# Patient Record
Sex: Female | Born: 1955 | Race: White | Hispanic: No | Marital: Single | Smoking: Never smoker
Health system: Southern US, Community
[De-identification: ages and names within clinical notes are randomized; demographics above are authoritative.]

## PROBLEM LIST (undated history)

## (undated) ENCOUNTER — Emergency Department (HOSPITAL_COMMUNITY): Discharge status: Against Medical Advice | Payer: Self-pay

## (undated) DIAGNOSIS — I10 Essential (primary) hypertension: Secondary | ICD-10-CM

## (undated) HISTORY — PX: CHOLECYSTECTOMY: SHX55

## (undated) HISTORY — PX: BACK SURGERY: SHX140

---

## 2006-02-03 ENCOUNTER — Ambulatory Visit: Payer: Self-pay | Admitting: Physician Assistant

## 2006-07-28 ENCOUNTER — Ambulatory Visit (HOSPITAL_COMMUNITY): Admission: RE | Admit: 2006-07-28 | Discharge: 2006-07-28 | Payer: Self-pay | Admitting: Neurological Surgery

## 2006-10-19 ENCOUNTER — Ambulatory Visit: Payer: Self-pay | Admitting: *Deleted

## 2006-10-19 ENCOUNTER — Inpatient Hospital Stay (HOSPITAL_COMMUNITY): Admission: RE | Admit: 2006-10-19 | Discharge: 2006-10-22 | Payer: Self-pay | Admitting: Neurological Surgery

## 2007-10-03 ENCOUNTER — Ambulatory Visit: Payer: Self-pay | Admitting: Internal Medicine

## 2008-05-13 IMAGING — RF DG LUMBAR SPINE 2-3V
1 series · 2 of 2 positions shown · non-contrast
Comparison: Lumbar myelogram 07/28/06.

CLINICAL DATA: L5-S1 ALIF fusion. 
 PORTABLE LUMBAR SPINE - 2 VIEW:

[Series 1: run · 2 of 2 slices shown]
[im 1/2]
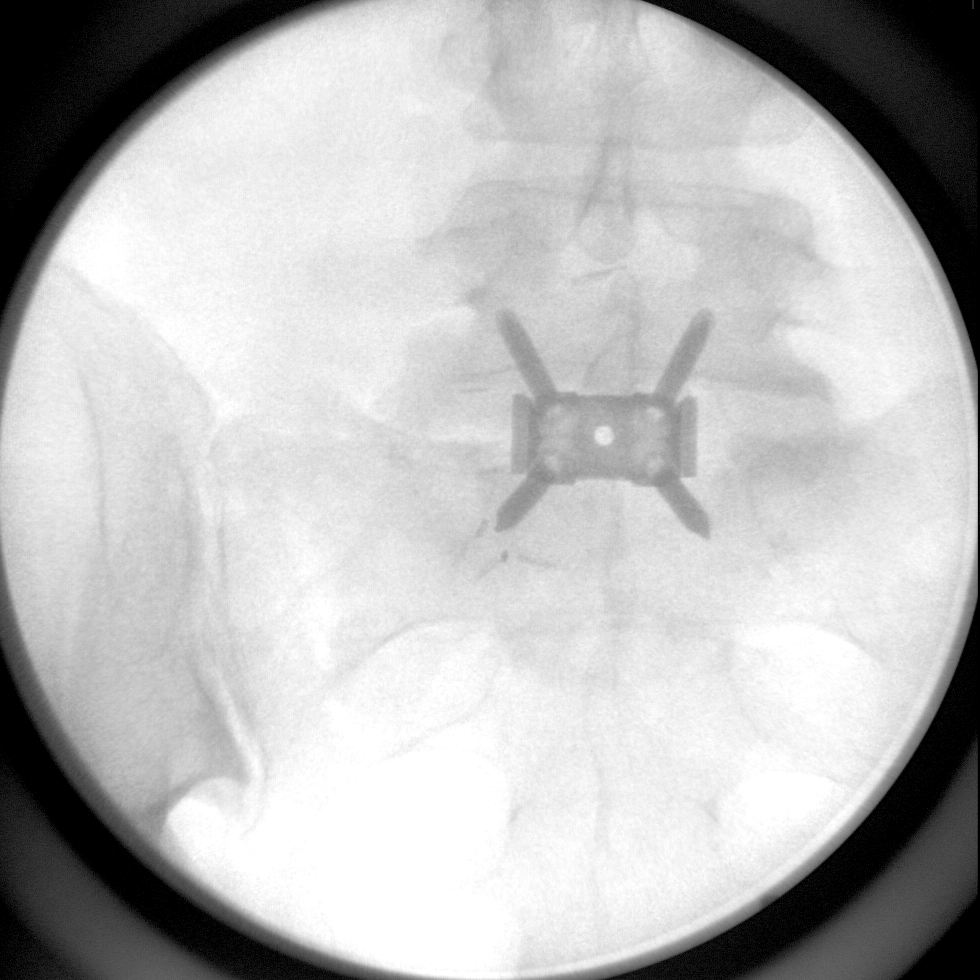
[im 2/2]
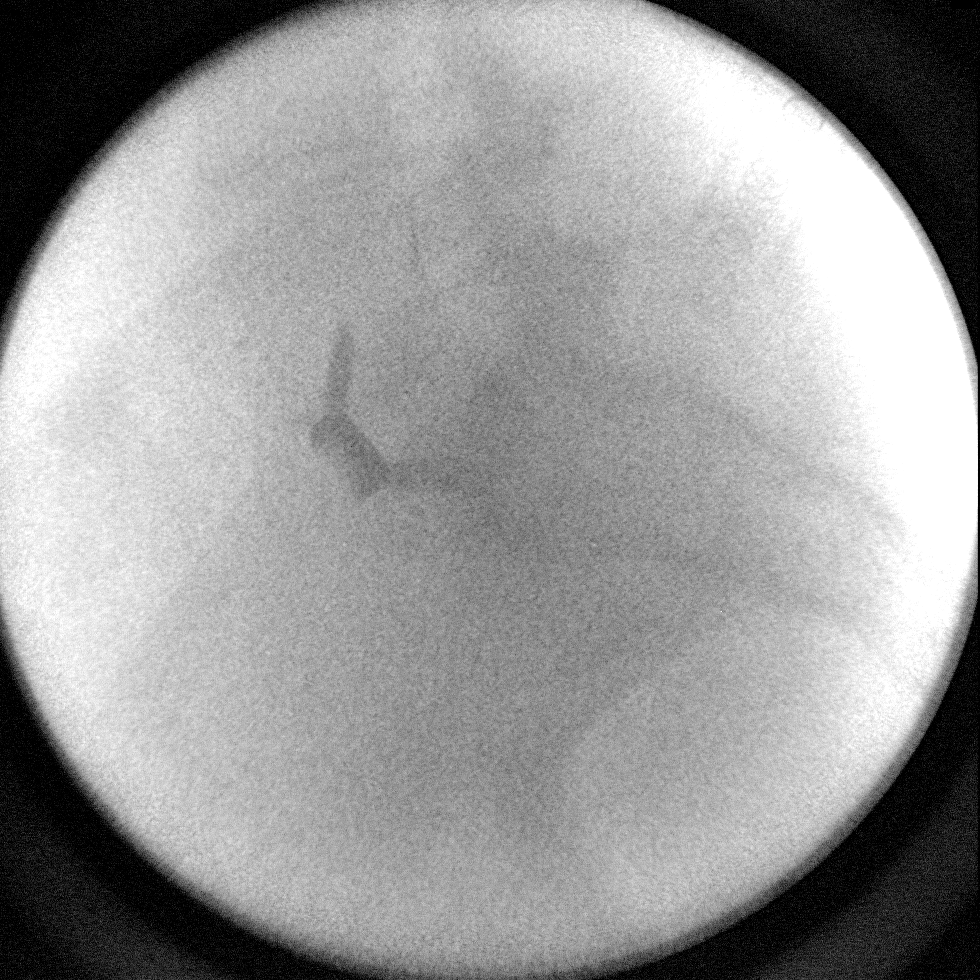

[2 of 2 positions shown; findings below may reference images not displayed]

FINDINGS: AP and lateral spot fluoroscopic images are submitted postoperatively from the operating room. These demonstrate the placement of an anterior fusion device at L5-S1, appearing in adequate position. There is no apparent complication.
IMPRESSION: Intraoperative views during anterior fusion at L5-S1.

## 2010-05-04 ENCOUNTER — Encounter: Payer: Self-pay | Admitting: Neurological Surgery

## 2010-08-26 NOTE — Op Note (Signed)
NAME:  [name], [name] NO.:  [id number]   MEDICAL RECORD NO.:  [id number]          PATIENT TYPE:  INP   LOCATION:  3034                         FACILITY:  [facility]   PHYSICIAN:  Balinda Quails, M.D.    DATE OF BIRTH:  1955-07-01   DATE OF PROCEDURE:  10/19/2006  DATE OF DISCHARGE:                               OPERATIVE REPORT   SURGEON:  Denman George, MD.   CO-SURGEON:  Stefani Dama MD.   ANESTHETIC:  General endotracheal.   ANESTHESIOLOGIST:  Bedelia Person, M.D.   PREOPERATIVE DIAGNOSIS:  L5-S1 degenerative disk disease.   POSTOPERATIVE DIAGNOSIS:  L5-S1 degenerative disk disease.   PROCEDURE:  Right retroperitoneal anterior spine access for L5-S1  anterior lumbar interbody fusion.   CLINICAL NOTE:  [name] is, a 55 year old obese female with history  of chronic back pain and degenerative L5-S1 disk disease.  Brought to  the operating room at this time for L5-S1 anterior lumbar interbody  fusion.  This was carried out in conjunction with Dr. Danielle Dess.  The  patient was seen preoperatively in the holding area, the procedure  explained.  Potential risks of the operative procedure were discussed  with the patient including but not limited to bleeding, infection, DVT  and pulmonary embolus, transfusion, limb ischemia, ureter injury, hernia  and death.   OPERATIVE PROCEDURE:  The patient brought to the operating room in  stable hemodynamic condition.  Placed in supine position.  General  endotracheal anesthesia induced.  Using fluoroscopy, the projection of  the L5-S1 disk space was marked on the anterior abdominal wall.  The  abdomen then prepped and draped in sterile fashion.  Pulse oximeter was  placed on the left foot.  This measured 100%.   A transverse skin incision made in the right lower quadrant from midline  to lateral margin of the rectus.  Subcutaneous tissue divided with  electrocautery.  The right anterior rectus sheath was divided from  midline to lateral border.  Superior and inferior flaps of anterior  rectus sheath were created.  The right rectus muscle was some mobilized  bluntly.  Retracted medially.  The retroperitoneal space entered in the  right lower quadrant.  The poster rectus sheath was divided at the  arcuate line.  The retroperitoneal space created bluntly.  The psoas  muscle and genitofemoral nerve were identified.  The genitofemoral nerve  left on the psoas muscle.  The ureter was then mobilized and retracted  with the abdominal contents.  The right common and external iliac artery  identified.  The right common iliac vein retracted to the right.  The L5-  S1 disk space was identified.  The presacral veins were cauterized with  bipolar device.  The fascia was incised over the L5-S1 disk.  Using  blunt dissection the L5-S1 disk was cleared from right to left.  The  left common iliac vein retracted to the left.   The Thompson-Brau retractor was then assembled.  Using the reverse Brau  lip blades, the L5-S1 disk was exposed.  Using malleable blades, the  disk was  further exposed superiorly and inferiorly.   Dr. Danielle Dess then completed excision of the disk with L5-S1 anterior  lumbar interbody fusion.   There were no apparent complications.  No significant bleeding.  Once  the ALIF was completed, the retractors removed.  Once again this was  inspected to assure there was no bleeding.  Pulse oximeter remained 100%  on the left foot.   The anterior rectus sheath was then closed with running 0 Vicryl suture.  The deep subcutaneous layer closed with running 2-0 Vicryl suture.  Subcutaneous tissue closed with running 3-0 Vicryl suture.  Skin closed  with 4-0 Monocryl.  Steri-Strips applied.  There were no apparent  complications.  The patient transferred to recovery room in stable  condition.      Balinda Quails, M.D.  Electronically Signed     PGH/MEDQ  D:  10/20/2006  T:  10/20/2006  Job:  [id number]

## 2010-08-26 NOTE — Op Note (Signed)
NAMEKARYNA, [name] NO.:  [id number]   MEDICAL RECORD NO.:  [id number]          PATIENT TYPE:  INP   LOCATION:  3034                         FACILITY:  [facility]   PHYSICIAN:  Stefani Dama, M.D.  DATE OF BIRTH:  21-Nov-1955   DATE OF PROCEDURE:  10/19/2006  DATE OF DISCHARGE:                               OPERATIVE REPORT   PREOPERATIVE DIAGNOSES:  1. Herniated nucleus pulposus L5-S1 extraforaminal.  2. Left L5 radiculopathy.   POSTOPERATIVE DIAGNOSES:  1. Herniated nucleus pulposus L5-S1 extraforaminal.  2. Left L5 radiculopathy.   PROCEDURES:  1. Anterior lumbar interbody decompression and fusion L5-S1 with      [employer] instrumentation.  2. Local autograft and allograft.   SURGEON:  Stefani Dama, M.D.   FIRST ASSISTANT:  Coletta Memos, M.D.   APPROACH AND CLOSURE:  Balinda Quails, M.D.   INDICATIONS:  [name] is a 55 year old individual who has had a long  time problem with chronic back pain and left lower extremity pain.  She  has an extraforaminal protrusion of the disk at the L5-S1 level on the  left.  After discussing considerations of conservative management and  surgical treatment for the past year's time, I have advised that it  would be best to treat the process and a need for decompression, and  also the stabilization of the L5-S1 joint; also anterior approach using  an anterior lumbar interbody fusion with a PEEK bone spacer from  [employer].  She is now being taken to the operating room for this  procedure.   PROCEDURE:  The patient was brought to the operating room, placed on the  table in supine position.  After the smooth induction of general  endotracheal anesthesia, fluoroscopic guidance was used to localize the  L5-S1 space.  Then, the exposure was performed by Dr. Liliane Bade to  expose the anterior lumbar interbody space at L5-S1.  The retractor  being set into position, the anterior longitudinal ligament at the disk  space  was opened with #15 blade.  A combination of Kerrison rongeurs was  then used to perform a complete diskectomy.  On left side in the lateral  gutter and the extraforaminal space, the disk was carefully cleaned out  and a ligament was noted to be bowed dorsally, with the subligamentous  portion of the disk in the region of the nerve root compression.  This  area was decompressed.  After removing all remnants of the disk off to  the left and to the right side, the disk space was inspected carefully  and all remnants of cartilage were removed.  The endplates were shaved  with a barrel bit and smoothed appropriately.  Good bleeding was  obtained from either cortex.  The interspace was sized and it was felt  that the smaller of the implants would fit best.  Initially a 13.5 mm  height was selected, but this did not provide sufficient distraction  posteriorly.  A 15 mm height was ultimately used in a small stature.  This was then filled with tricalcium phosphate bone substitute, and  a  bone marrow aspirate was obtained from the vertebral body of L5 using a  Jamshidi needle.  The bone marrow aspirate was mixed with tricalcium  phosphate, and this was packed into the interspace after being packed  into the PEEK spacer.  The Squid interbody applicator was used to place  the spacer along with the titanium anterior plate.  The plate was  appropriately countersunk once the Squid was removed.  Four 20 mm screws  were then used to secure the plate and the anterior device in position.  Final radiographic appearance of the interbody spacer was obtained with  AP and lateral fluoroscopic imaging.  When this was found to be good,  the procedure was then turned back over to Dr. Madilyn Fireman for final closure.   Blood loss for this portion of procedure was estimated about 100 mL.  The patient tolerated the procedure well.      Stefani Dama, M.D.  Electronically Signed     HJE/MEDQ  D:  10/19/2006  T:   10/20/2006  Job:  [id number]

## 2011-01-27 LAB — POCT I-STAT 4, (NA,K, GLUC, HGB,HCT)
Glucose, Bld: 98
HCT: 46
Operator id: 181601
Potassium: 4.1
Sodium: 135

## 2011-01-27 LAB — TYPE AND SCREEN
ABO/RH(D): B POS
Antibody Screen: NEGATIVE

## 2011-01-27 LAB — ABO/RH: ABO/RH(D): B POS

## 2011-01-27 LAB — BASIC METABOLIC PANEL
Calcium: 9.4
Creatinine, Ser: 0.65
GFR calc Af Amer: >60
Sodium: 139

## 2011-01-27 LAB — CBC
Hemoglobin: 13.2
RBC: 4.93
WBC: 9.1

## 2013-10-11 ENCOUNTER — Other Ambulatory Visit: Payer: Self-pay | Admitting: Neurological Surgery

## 2013-10-11 DIAGNOSIS — M5416 Radiculopathy, lumbar region: Secondary | ICD-10-CM

## 2013-10-22 ENCOUNTER — Ambulatory Visit
Admission: RE | Admit: 2013-10-22 | Discharge: 2013-10-22 | Disposition: A | Discharge status: Home / Self Care | Payer: BC Managed Care – PPO | Source: Ambulatory Visit | Attending: Neurological Surgery | Admitting: Neurological Surgery

## 2013-10-22 DIAGNOSIS — M5416 Radiculopathy, lumbar region: Secondary | ICD-10-CM

## 2014-01-18 ENCOUNTER — Other Ambulatory Visit (HOSPITAL_COMMUNITY): Payer: Self-pay | Admitting: Neurological Surgery

## 2014-01-18 ENCOUNTER — Other Ambulatory Visit: Payer: Self-pay | Admitting: Neurological Surgery

## 2014-01-18 DIAGNOSIS — M5410 Radiculopathy, site unspecified: Secondary | ICD-10-CM

## 2014-02-02 ENCOUNTER — Encounter (HOSPITAL_COMMUNITY): Payer: Self-pay | Admitting: Pharmacy Technician

## 2014-02-08 ENCOUNTER — Ambulatory Visit (HOSPITAL_COMMUNITY): Admission: RE | Admit: 2014-02-08 | Payer: BC Managed Care – PPO | Source: Ambulatory Visit

## 2014-02-22 ENCOUNTER — Ambulatory Visit (HOSPITAL_COMMUNITY)
Admission: RE | Admit: 2014-02-22 | Discharge: 2014-02-22 | Disposition: A | Discharge status: Home / Self Care | Payer: BC Managed Care – PPO | Source: Ambulatory Visit | Attending: Neurological Surgery | Admitting: Neurological Surgery

## 2014-02-22 DIAGNOSIS — M5136 Other intervertebral disc degeneration, lumbar region: Secondary | ICD-10-CM | POA: Diagnosis not present

## 2014-02-22 DIAGNOSIS — M4726 Other spondylosis with radiculopathy, lumbar region: Secondary | ICD-10-CM | POA: Diagnosis present

## 2014-02-22 DIAGNOSIS — M2578 Osteophyte, vertebrae: Secondary | ICD-10-CM | POA: Diagnosis not present

## 2014-02-22 DIAGNOSIS — M5410 Radiculopathy, site unspecified: Secondary | ICD-10-CM

## 2014-02-22 DIAGNOSIS — M5125 Other intervertebral disc displacement, thoracolumbar region: Secondary | ICD-10-CM | POA: Diagnosis not present

## 2014-02-22 DIAGNOSIS — Z981 Arthrodesis status: Secondary | ICD-10-CM | POA: Diagnosis not present

## 2014-02-22 DIAGNOSIS — M47898 Other spondylosis, sacral and sacrococcygeal region: Secondary | ICD-10-CM | POA: Insufficient documentation

## 2014-02-22 DIAGNOSIS — M4716 Other spondylosis with myelopathy, lumbar region: Secondary | ICD-10-CM | POA: Diagnosis present

## 2014-02-22 MED ORDER — ONDANSETRON HCL 4 MG/2ML IJ SOLN: 4.0000 mg | Freq: Four times a day (QID) | INTRAMUSCULAR | Status: DC | PRN

## 2014-02-22 MED ORDER — DIAZEPAM 5 MG PO TABS
ORAL_TABLET | ORAL | Status: AC
  Filled 2014-02-22: qty 2

## 2014-02-22 MED ORDER — ONDANSETRON HCL 4 MG/2ML IJ SOLN
4.0000 mg | Freq: Four times a day (QID) | INTRAMUSCULAR | Status: DC | PRN
Start: 2014-02-22 — End: 2014-02-23

## 2014-02-22 MED ORDER — DIAZEPAM 5 MG PO TABS
10.0000 mg | ORAL_TABLET | Freq: Once | ORAL | Status: AC
  Administered 2014-02-22: 10 mg via ORAL

## 2014-02-22 MED ORDER — MAGNESIUM HYDROXIDE 400 MG/5ML PO SUSP
30.0000 mL | Freq: Every day | ORAL | Status: DC | PRN
  Filled 2014-02-22: qty 30

## 2014-02-22 MED ORDER — HYDROCODONE-ACETAMINOPHEN 5-325 MG PO TABS: 1.0000 | ORAL_TABLET | ORAL | Status: DC | PRN

## 2014-02-22 MED ORDER — IOHEXOL 180 MG/ML  SOLN
20.0000 mL | Freq: Once | INTRAMUSCULAR | Status: AC | PRN
  Administered 2014-02-22: 12 mL via INTRATHECAL

## 2014-02-22 NOTE — Discharge Instructions (Signed)
Myelogram and Lumbar Puncture Discharge Instructions  1. Go home and rest quietly for the next 24 hours.  It is important to lie flat for the next 24 hours.  Get up only to go to the restroom.  You may lie in the bed or on a couch on your back, your stomach, your left side or your right side.  You may have one pillow under your head.  You may have pillows between your knees while you are on your side or under your knees while you are on your back.  2. DO NOT drive today.  Recline the seat as far back as it will go, while still wearing your seat belt, on the way home.  3. You may get up to go to the bathroom as needed.  You may sit up for 10 minutes to eat.  You may resume your normal diet and medications unless otherwise indicated.  4. The incidence of headache, nausea, or vomiting is about 5% (one in 20 patients).  If you develop a headache, lie flat and drink plenty of fluids until the headache goes away.  Caffeinated beverages may be helpful.  If you develop severe nausea and vomiting or a headache that does not go away with flat bed rest, call [phone].  5. You may resume normal activities after your 24 hours of bed rest is over; however, do not exert yourself strongly or do any heavy lifting tomorrow.  6. Call your physician for a follow-up appointment.  The results of your myelogram will be sent directly to your physician by the following day.  7. If you have any questions or if complications develop after you arrive home, please call [phone].  Discharge instructions have been explained to the patient.  The patient, or the person responsible for the patient, fully understands these instructions.   

## 2014-02-22 NOTE — Procedures (Signed)
Excell SeltzerKathy Preston is a 58 year old individual who has had severe cramping in both lower extremities worse on the left than on the right with numbness and tingling in the left lower extremity. She has had a previous decompression and fusion at L5-S1 done via an anterior procedure. She did well for appear to time but then had recurrence of pain. Plain radiographs demonstrate significant adjacent level disease with degeneration at L1-L2.she has had primarily left lower extremity pain and some weakness. An MRI performed in July demonstrates degenerative changes but no overt neural component compressive lesion. There is some degradation secondary to motion. A myelogram is now being performed to better delineate any path of anatomy that may be helped.   Pre op WU:[id number]:spondylosis lumbar with left lumbar radiculopathy. Status post arthrodesis L5-S1 Post op FA:[id number]:spondylosis lumbar with left lumbar radiculopathy. Status post arthrodesis L5-S1 Procedure:lumbar myelogram Surgeon:Birtha Hatler Puncture level:L2-L3 Fluid color:clear colorless Injection:iohexol 180 12 mL Findings:diffuse spondylosis at L4-L5 no overt nerve root compromise, further evaluation with post myelogram CT scan.

## 2015-08-08 ENCOUNTER — Emergency Department
Admission: EM | Admit: 2015-08-08 | Discharge: 2015-08-08 | Disposition: A | Discharge status: Home / Self Care | Payer: Self-pay | Attending: Emergency Medicine | Admitting: Emergency Medicine

## 2015-08-08 ENCOUNTER — Emergency Department: Payer: Self-pay

## 2015-08-08 DIAGNOSIS — R1013 Epigastric pain: Secondary | ICD-10-CM | POA: Insufficient documentation

## 2015-08-08 LAB — COMPREHENSIVE METABOLIC PANEL
ALK PHOS: 171 U/L — AB (ref 38–126)
ALT: 94 U/L — ABNORMAL HIGH (ref 14–54)
AST: 55 U/L — ABNORMAL HIGH (ref 15–41)
Albumin: 4 g/dL (ref 3.5–5.0)
Anion gap: 14 (ref 5–15)
BUN: 15 mg/dL (ref 6–20)
CALCIUM: 10.3 mg/dL (ref 8.9–10.3)
CO2: 28 mmol/L (ref 22–32)
Chloride: 98 mmol/L — ABNORMAL LOW (ref 101–111)
Creatinine, Ser: 0.58 mg/dL (ref 0.44–1.00)
Glucose, Bld: 130 mg/dL — ABNORMAL HIGH (ref 65–99)
Potassium: 3.1 mmol/L — ABNORMAL LOW (ref 3.5–5.1)
Sodium: 140 mmol/L (ref 135–145)
Total Bilirubin: 1.4 mg/dL — ABNORMAL HIGH (ref 0.3–1.2)
Total Protein: 8.2 g/dL — ABNORMAL HIGH (ref 6.5–8.1)

## 2015-08-08 LAB — URINALYSIS COMPLETE WITH MICROSCOPIC (ARMC ONLY)
BACTERIA UA: NONE SEEN
BILIRUBIN URINE: NEGATIVE
Glucose, UA: NEGATIVE mg/dL
Leukocytes, UA: NEGATIVE
Nitrite: NEGATIVE
Protein, ur: NEGATIVE mg/dL
Specific Gravity, Urine: 1.014 (ref 1.005–1.030)
pH: 7 (ref 5.0–8.0)

## 2015-08-08 LAB — CBC
HEMATOCRIT: 41.2 % (ref 35.0–47.0)
HEMOGLOBIN: 13.8 g/dL (ref 12.0–16.0)
MCH: 26.9 pg (ref 26.0–34.0)
MCHC: 33.4 g/dL (ref 32.0–36.0)
MCV: 80.6 fL (ref 80.0–100.0)
PLATELETS: 304 10*3/uL (ref 150–440)
RBC: 5.11 MIL/uL (ref 3.80–5.20)
RDW: 13.9 % (ref 11.5–14.5)
WBC: 13.8 10*3/uL — AB (ref 3.6–11.0)

## 2015-08-08 LAB — TROPONIN I: Troponin I: <0.03 ng/mL (ref <0.031)

## 2015-08-08 LAB — LIPASE, BLOOD: Lipase: 25 U/L (ref 11–51)

## 2015-08-08 MED ORDER — DICYCLOMINE HCL 20 MG PO TABS: 20.0000 mg | ORAL_TABLET | Freq: Three times a day (TID) | ORAL | Status: DC | PRN

## 2015-08-08 MED ORDER — GI COCKTAIL ~~LOC~~
30.0000 mL | Freq: Once | ORAL | Status: AC
  Administered 2015-08-08: 30 mL via ORAL
  Filled 2015-08-08: qty 30

## 2015-08-08 MED ORDER — ONDANSETRON HCL 4 MG/2ML IJ SOLN
INTRAMUSCULAR | Status: AC
  Administered 2015-08-08: 4 mg via INTRAVENOUS
  Filled 2015-08-08: qty 2

## 2015-08-08 MED ORDER — ONDANSETRON HCL 4 MG PO TABS: 4.0000 mg | ORAL_TABLET | Freq: Three times a day (TID) | ORAL | Status: DC | PRN

## 2015-08-08 MED ORDER — POTASSIUM CHLORIDE CRYS ER 20 MEQ PO TBCR
20.0000 meq | EXTENDED_RELEASE_TABLET | Freq: Once | ORAL | Status: AC
  Administered 2015-08-08: 20 meq via ORAL
  Filled 2015-08-08: qty 1

## 2015-08-08 MED ORDER — SODIUM CHLORIDE 0.9 % IV BOLUS (SEPSIS)
500.0000 mL | Freq: Once | INTRAVENOUS | Status: AC
  Administered 2015-08-08: 500 mL via INTRAVENOUS

## 2015-08-08 MED ORDER — ONDANSETRON HCL 4 MG/2ML IJ SOLN
4.0000 mg | Freq: Once | INTRAMUSCULAR | Status: AC
  Administered 2015-08-08: 4 mg via INTRAVENOUS

## 2015-08-08 MED ORDER — ONDANSETRON HCL 4 MG/2ML IJ SOLN
4.0000 mg | Freq: Once | INTRAMUSCULAR | Status: AC
  Administered 2015-08-08: 4 mg via INTRAVENOUS
  Filled 2015-08-08: qty 2

## 2015-08-08 NOTE — ED Notes (Signed)
Patient transported to Ultrasound 

## 2015-08-08 NOTE — ED Notes (Signed)
Pt in with co n.v.d since this am with co "fullness to her chest".  Denies any abd pain but has had a fever.

## 2015-08-08 NOTE — ED Provider Notes (Signed)
Time Seen: Approximately 2000 I have reviewed the triage notes  Chief Complaint: Emesis   History of Present Illness: [name] is a 60 y.o. female *who states that she's having some epigastric pain and some fullness in the lower part of her chest. Patient states she started this morning with nausea and vomited multiple times with no obvious blood or bile. Patient states multiple loose watery stool bowel movements. She denies any recent antibiotic therapy. She denies any recent travel or food borne exposure. She denies any melena or hematochezia. She states she was concerned because the pain and points mainly to the lower sternal region. She states it occasionally hurts to take a deep breath. She denies any arm, jaw, back pain. No past medical history on file.  There are no active problems to display for this patient.   No past surgical history on file.  No past surgical history on file.  Current Outpatient Rx  Name  Route  Sig  Dispense  Refill  . chlorthalidone (HYGROTON) 25 MG tablet   Oral   Take 25 mg by mouth daily.         Maxwell Caul Bicarbonate (ZEGERID OTC) 20-1100 MG CAPS capsule   Oral   Take 1 capsule by mouth daily as needed (for acid reflux).         . traMADol (ULTRAM) 50 MG tablet   Oral   Take 50 mg by mouth daily as needed for moderate pain.           Allergies:  Oxycodone-acetaminophen  Family History: No family history on file.  Social History: Social History  Substance Use Topics  . Smoking status: Not on file  . Smokeless tobacco: Not on file  . Alcohol Use: Not on file     Review of Systems:   10 point review of systems was performed and was otherwise negative:  Constitutional: No fever Eyes: No visual disturbances ENT: No sore throat, ear pain Cardiac: No chest pain Respiratory: No shortness of breath, wheezing, or stridor Abdomen: Epigastric abdominal pain with nausea vomiting and loose watery stool Endocrine: No  weight loss, No night sweats Extremities: No peripheral edema, cyanosis Skin: No rashes, easy bruising Neurologic: No focal weakness, trouble with speech or swollowing Urologic: No dysuria, Hematuria, or urinary frequency   Physical Exam:  ED Triage Vitals  Enc Vitals Group     BP 08/08/15 1912 154/131 mmHg     Pulse Rate 08/08/15 1912 88     Resp 08/08/15 1912 18     Temp 08/08/15 1912 98.5 F (36.9 C)     Temp Source 08/08/15 1912 Oral     SpO2 08/08/15 1912 98 %     Weight 08/08/15 1912 170 lb (77.111 kg)     Height 08/08/15 1912  (1.6 m)     Head Cir --      Peak Flow --      Pain Score 08/08/15 1912 9     Pain Loc --      Pain Edu? --      Excl. in GC? --     General: Awake , Alert , and Oriented times 3; GCS 15 Head: Normal cephalic , atraumatic Eyes: Pupils equal , round, reactive to light Nose/Throat: No nasal drainage, patent upper airway without erythema or exudate.  Neck: Supple, Full range of motion, No anterior adenopathy or palpable thyroid masses Lungs: Clear to ascultation without wheezes , rhonchi, or rales Heart: Regular rate,  regular rhythm without murmurs , gallops , or rubs Abdomen: Some reproducible pain across the epigastric area without rebound, guarding , or rigidity; bowel sounds positive and symmetric in all 4 quadrants. No organomegaly .   Negative Murphy's sign     Extremities: 2 plus symmetric pulses. No edema, clubbing or cyanosis Neurologic: normal ambulation, Motor symmetric without deficits, sensory intact Skin: warm, dry, no rashes   Labs:   All laboratory work was reviewed including any pertinent negatives or positives listed below:  Labs Reviewed  CBC - Abnormal; Notable for the following:    WBC 13.8 (*)    All other components within normal limits  LIPASE, BLOOD  COMPREHENSIVE METABOLIC PANEL  URINALYSIS COMPLETEWITH MICROSCOPIC ([facility] ONLY)  TROPONIN I    EKG: * ED ECG REPORT I, Jennye MoccasinBrian S Kiko Ripp, the attending  physician, personally viewed and interpreted this ECG.  Date: 08/08/2015 EKG Time: 1916 Rate: 89 Rhythm: normal sinus rhythm QRS Axis: Left axis deviation Intervals: normal ST/T Wave abnormalities: normal Conduction Disturbances: none Narrative Interpretation: unremarkable QRS complexes noticed across the inferior leads No acute ischemic changes  Radiology:   Gallbladder:  Gallstones are identified within the gallbladder. There is probable gallbladder wall thickening. No sonographic Murphy sign noted by sonographer.  Common bile duct:  Diameter: 9 mm proximally which tapers to normal caliber distally.  Liver:  No focal lesion identified. Within normal limits in parenchymal echogenicity.  There is incidental finding of right pleural effusion.  IMPRESSION: Findings are equivocal for acute cholecystitis with gallstones and probable gallbladder wall thickening. No sonographic Eulah PontMurphy sign was reported. Clinical correlation is recommended.     \I personally reviewed the radiologic studies     ED Course:  Patient's stay here was uneventful and she had symptomatic improvement with anti-medic therapy and a GI cocktail. Her pain is located epigastric region and I felt this was unlikely to be cardiovascular or intrathoracic especially given her presentation with nausea vomiting and diarrhea with onset this morning. Patient had relief with a GI cocktail and her nausea was corrected with the anti-medic therapy. Patient will be discharged with Zofran and Bentyl for pain. Her potassium is slightly low and I felt would correct itself with control of her nausea and vomiting. This most likely is viral based on the frequency of rotavirus and normal virus here in the community. It appears to be of understanding advised follow-up with her primary physician especially if she develops upper chest pain, focal abdominal pain or return here to the emergency department.*    Assessment: * Acute  epigastric pain Nausea vomiting and diarrhea Likely viral gastroenteritis   Final Clinical Impression:  Final diagnoses:  Epigastric abdominal pain     Plan: * Outpatient management Patient was advised to return immediately if condition worsens. Patient was advised to follow up with their primary care physician or other specialized physicians involved in their outpatient care. The patient and/or family member/power of attorney had laboratory results reviewed at the bedside. All questions and concerns were addressed and appropriate discharge instructions were distributed by the nursing staff.            Jennye MoccasinBrian S Agam Davenport, MD 08/08/15 [phone]

## 2017-03-24 IMAGING — US US ABDOMEN LIMITED
1 series · 14 of 25 positions shown · non-contrast
Comparison: None.

CLINICAL DATA: Epigastric abdominal pain for 10 days.

EXAM:
US ABDOMEN LIMITED - RIGHT UPPER QUADRANT

[Series 1: us abdomen limited · 0.17mm/px · 14 of 70 slices shown]
[im 1/70]
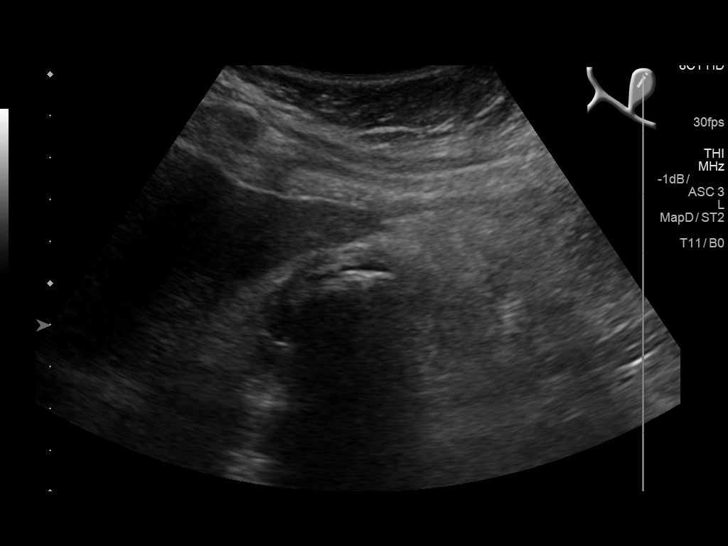
[im 6/70]
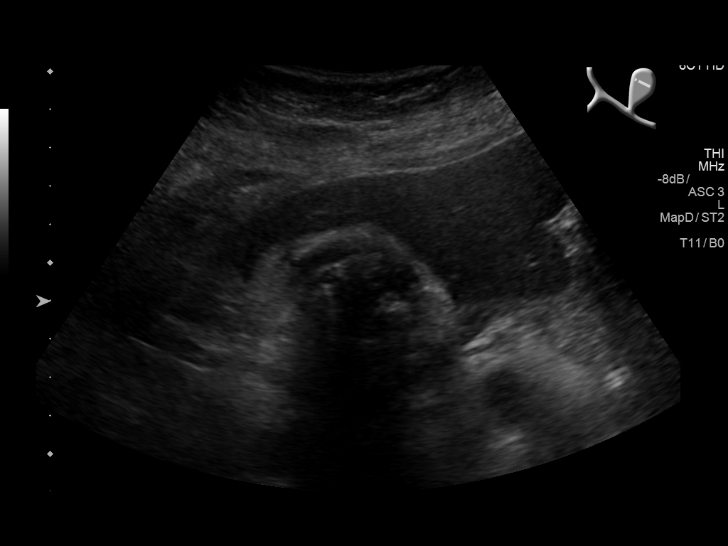
[im 12/70]
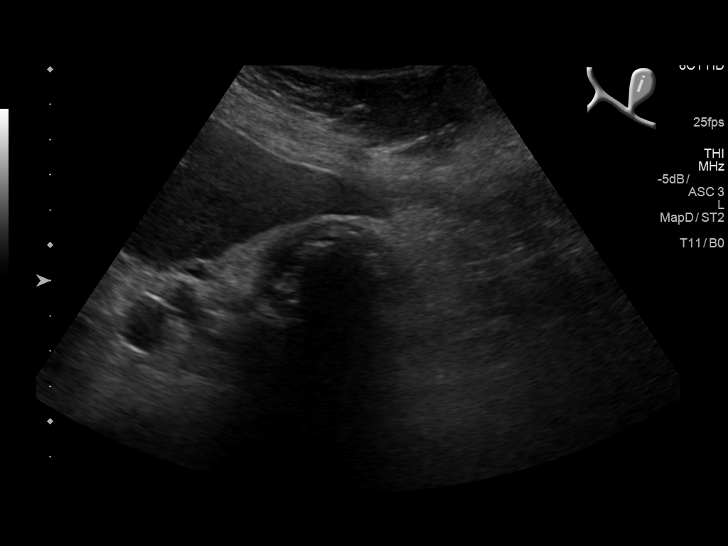
[im 18/70]
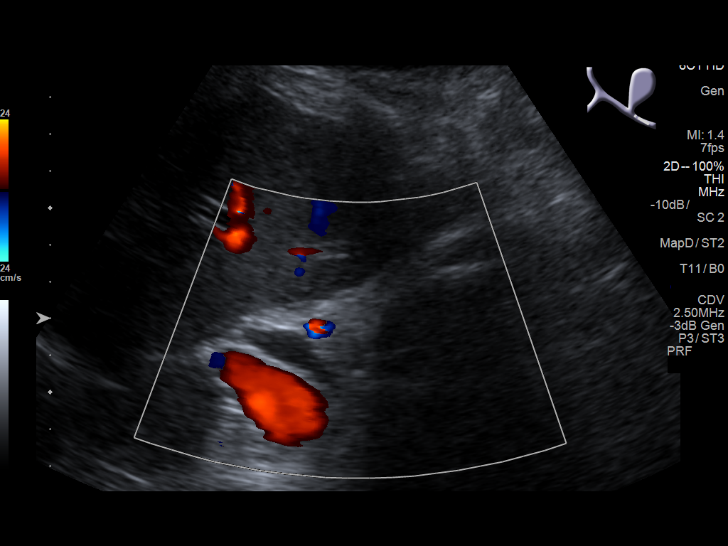
[im 24/70]
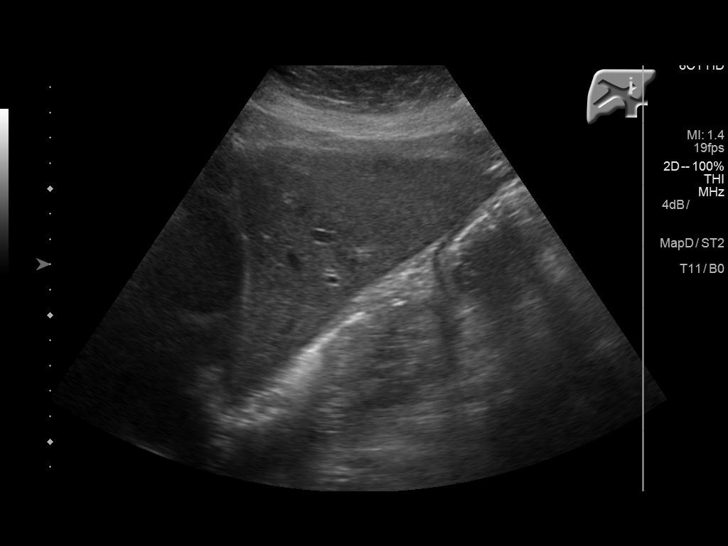
[im 26/70]
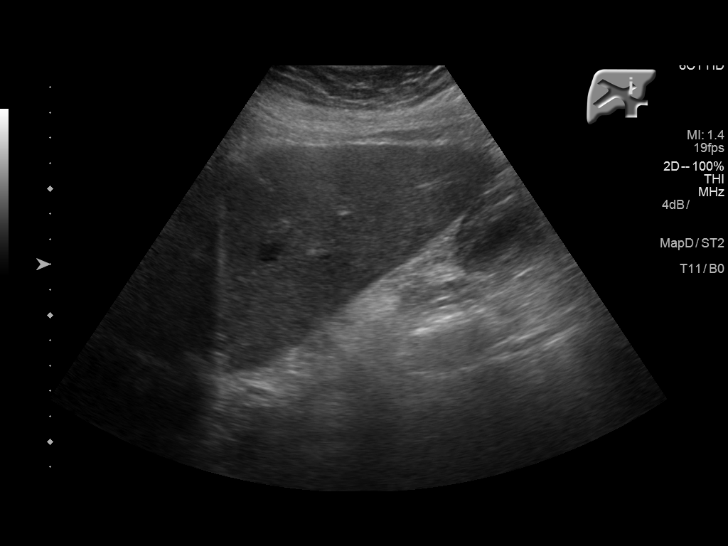
[im 32/70]
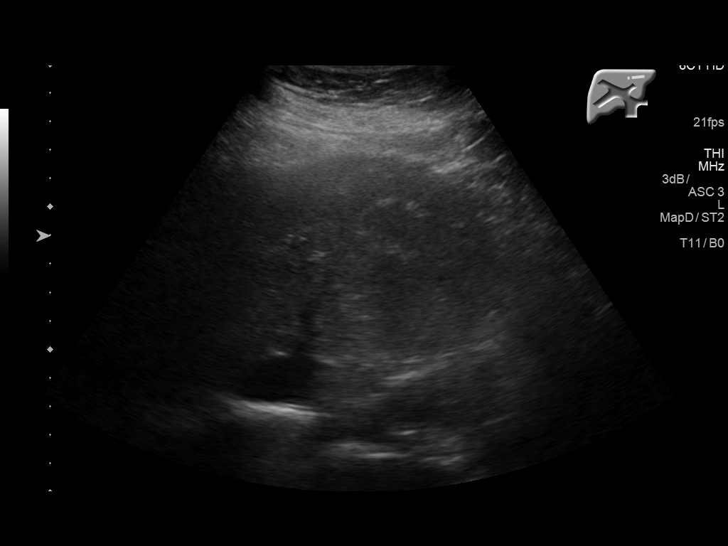
[im 38/70]
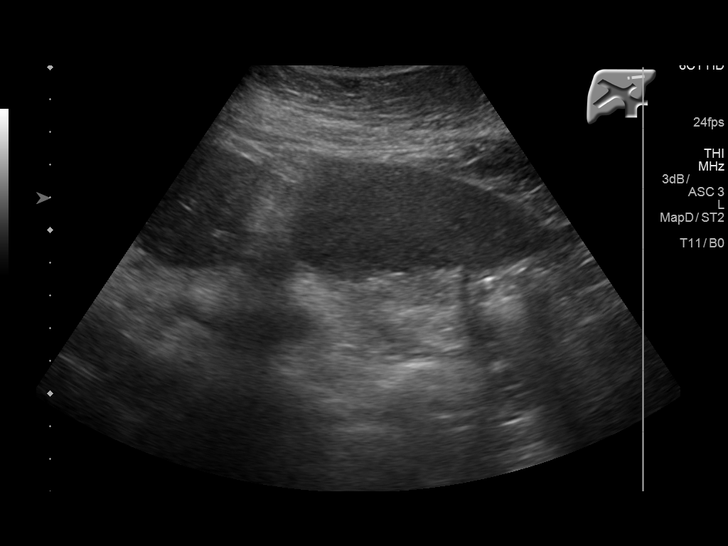
[im 44/70]
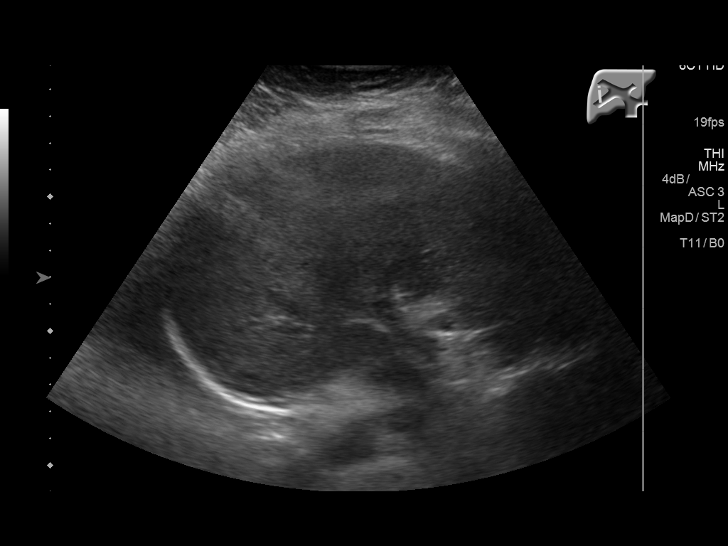
[im 47/70]
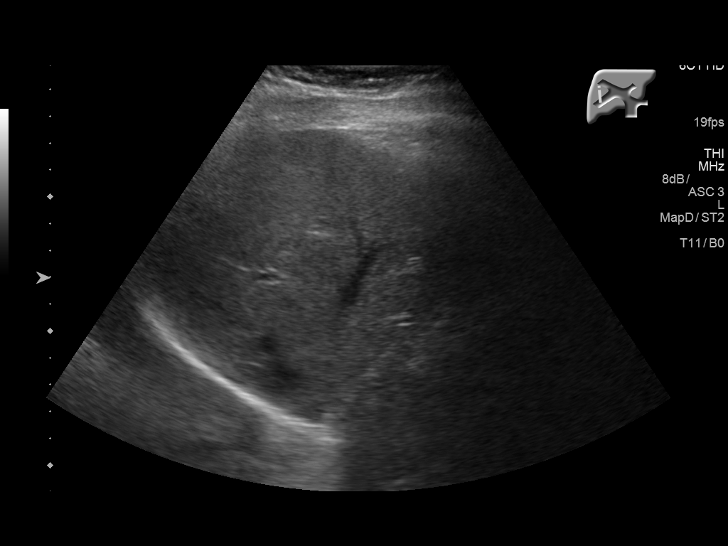
[im 52/70]
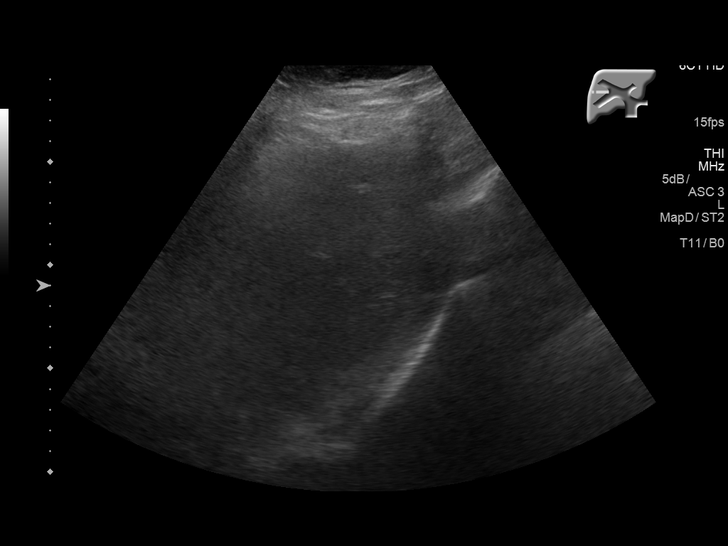
[im 58/70]
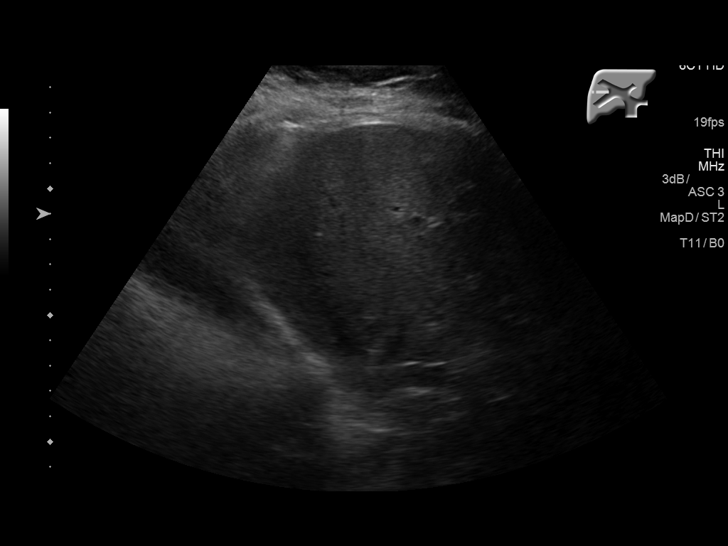
[im 64/70]
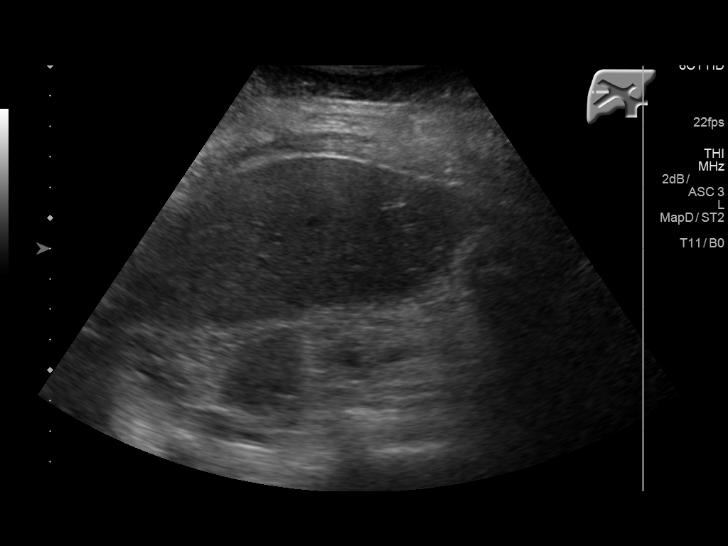
[im 70/70]
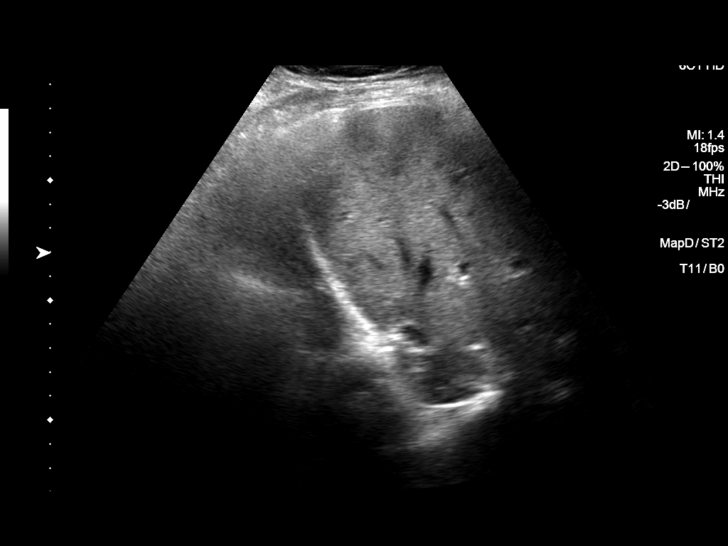

[14 of 25 positions shown; findings below may reference images not displayed]

FINDINGS: Gallbladder:

Gallstones are identified within the gallbladder. There is probable
gallbladder wall thickening. No sonographic Murphy sign noted by
sonographer.

Common bile duct:

Diameter: 9 mm proximally which tapers to normal caliber distally.

Liver:

No focal lesion identified. Within normal limits in parenchymal
echogenicity.

There is incidental finding of right pleural effusion.
IMPRESSION: Findings are equivocal for acute cholecystitis with gallstones and
probable gallbladder wall thickening. No sonographic Murphy sign was
reported. Clinical correlation is recommended.

## 2019-10-06 ENCOUNTER — Emergency Department
Admission: EM | Admit: 2019-10-06 | Discharge: 2019-10-06 | Disposition: A | Discharge status: Home / Self Care | Payer: Self-pay | Attending: Emergency Medicine | Admitting: Emergency Medicine

## 2019-10-06 ENCOUNTER — Encounter: Payer: Self-pay | Admitting: Emergency Medicine

## 2019-10-06 ENCOUNTER — Emergency Department: Payer: Self-pay

## 2019-10-06 ENCOUNTER — Other Ambulatory Visit: Payer: Self-pay

## 2019-10-06 DIAGNOSIS — L03115 Cellulitis of right lower limb: Secondary | ICD-10-CM | POA: Insufficient documentation

## 2019-10-06 DIAGNOSIS — R600 Localized edema: Secondary | ICD-10-CM | POA: Insufficient documentation

## 2019-10-06 DIAGNOSIS — R609 Edema, unspecified: Secondary | ICD-10-CM

## 2019-10-06 DIAGNOSIS — I10 Essential (primary) hypertension: Secondary | ICD-10-CM | POA: Insufficient documentation

## 2019-10-06 HISTORY — DX: Essential (primary) hypertension: I10

## 2019-10-06 MED ORDER — SULFAMETHOXAZOLE-TRIMETHOPRIM 800-160 MG PO TABS: 1.0000 | ORAL_TABLET | Freq: Two times a day (BID) | ORAL | 0 refills | Status: DC

## 2019-10-06 NOTE — ED Triage Notes (Signed)
Pt reports last week she fell through a porch and is still having pain to her right leg. Pt is ambulatory

## 2019-10-06 NOTE — ED Provider Notes (Signed)
[facility] Emergency Department Provider Note ____________________________________________  Time seen: Approximately 1:15 PM  I have reviewed the triage vital signs and the nursing notes.   HISTORY  Chief Complaint Fall and Leg Pain    HPI [name] is a 64 y.o. female who presents to the emergency department for evaluation and treatment of right lower extremity swelling. Her leg went through the porch about 2 weeks ago. Pain has gone down significantly but she has developed swelling and "tightness' with red areas. No history of DVT   Past Medical History:  Diagnosis Date  . Hypertension     There are no problems to display for this patient.   History reviewed. No pertinent surgical history.  Prior to Admission medications   Medication Sig Start Date End Date Taking? Authorizing Provider  chlorthalidone (HYGROTON) 25 MG tablet Take 25 mg by mouth daily.    [provider]  dicyclomine (BENTYL) 20 MG tablet Take 1 tablet (20 mg total) by mouth 3 (three) times daily as needed for spasms. 08/08/15   Jennye Moccasin, MD  Omeprazole-Sodium Bicarbonate (ZEGERID OTC) 20-1100 MG CAPS capsule Take 1 capsule by mouth daily as needed (for acid reflux).    [provider]  sulfamethoxazole-trimethoprim (BACTRIM DS) 800-160 MG tablet Take 1 tablet by mouth 2 (two) times daily. 10/06/19   Tyquez Hollibaugh, Rulon Eisenmenger B, FNP  traMADol (ULTRAM) 50 MG tablet Take 50 mg by mouth daily as needed for moderate pain.    [provider]    Allergies Oxycodone-acetaminophen  No family history on file.  Social History Social History   Tobacco Use  . Smoking status: Not on file  Substance Use Topics  . Alcohol use: Not on file  . Drug use: Not on file    Review of Systems Constitutional: Negative for fever. Cardiovascular: Negative for chest pain. Respiratory: Negative for shortness of breath. Musculoskeletal: Positive for right lower extremity  tenderness and swelling. Skin: Erythematous, swollen right calf to knee.  Neurological: Negative for decrease in sensation  ____________________________________________   PHYSICAL EXAM:  VITAL SIGNS: ED Triage Vitals  Enc Vitals Group     BP 10/06/19 1045 (!) 166/80     Pulse Rate 10/06/19 1045 95     Resp 10/06/19 1045 18     Temp 10/06/19 1045 97.8 F (36.6 C)     Temp Source 10/06/19 1045 Oral     SpO2 10/06/19 1045 96 %     Weight 10/06/19 1042 170 lb (77.1 kg)     Height 10/06/19 1042 5\' 3"  (1.6 m)     Head Circumference --      Peak Flow --      Pain Score 10/06/19 1042 0     Pain Loc --      Pain Edu? --      Excl. in [place]? --     Constitutional: Alert and oriented. Well appearing and in no acute distress. Eyes: Conjunctivae are clear without discharge or drainage Head: Atraumatic Neck: Supple Respiratory: No cough. Respirations are even and unlabored. Musculoskeletal: Ambulates without difficulty.  Neurologic: Awake, alert, oriented Skin: Right lower extremity shows an area on the posterior thigh that appears as a lipoma.  Right lower extremity below the knee is swollen with an area of redness over the pretibial aspect.  No pitting edema is noted. Psychiatric: Affect and behavior are appropriate.  ____________________________________________   LABS (all labs ordered are listed, but only abnormal results are displayed)  Labs  Reviewed - No data to display ____________________________________________  RADIOLOGY  Ultrasound of the right lower extremity shows no evidence of DVT.  I, Sherrie George, personally viewed and evaluated these images (plain radiographs) as part of my medical decision making, as well as reviewing the written report by the radiologist.  US Venous Img Lower Unilateral Right  Result Date: 10/06/2019 CLINICAL DATA:  Acute right lower extremity swelling. EXAM: Right LOWER EXTREMITY VENOUS DOPPLER ULTRASOUND TECHNIQUE: Gray-scale sonography  with compression, as well as color and duplex ultrasound, were performed to evaluate the deep venous system(s) from the level of the common femoral vein through the popliteal and proximal calf veins. COMPARISON:  None. FINDINGS: VENOUS Normal compressibility of the common femoral, superficial femoral, and popliteal veins, as well as the visualized calf veins. Visualized portions of profunda femoral vein and great saphenous vein unremarkable. No filling defects to suggest DVT on grayscale or color Doppler imaging. Doppler waveforms show normal direction of venous flow, normal respiratory plasticity and response to augmentation. Limited views of the contralateral common femoral vein are unremarkable. OTHER None. Limitations: none IMPRESSION: Negative. Electronically Signed   By: Marijo Conception M.D.   On: 10/06/2019 12:23   ____________________________________________   PROCEDURES  Procedures  ____________________________________________   INITIAL IMPRESSION / ASSESSMENT AND PLAN / ED COURSE  [name] is a 64 y.o. who presents to the emergency department for treatment and evaluation of right lower extremity redness and swelling that has been progressively worsening since her leg went through the floor of the porch.  See HPI for further details.  Differential diagnosis includes but is not limited to: DVT, cellulitis, soft tissue injury  Ultrasound of the right lower extremity is negative for acute findings.  Plan will be to place her on Bactrim for treatment of cellulitis.  Patient declines any type of pain medications.  She was encouraged to rest and elevate her leg as often as she can over the next few days.  If symptoms or not improving with medications and rest she is to follow-up with her primary care provider.  She is to return to the emergency department for symptoms of change or worsen if she is unable to schedule appointment.  Medications - No data to display  Pertinent labs &  imaging results that were available during my care of the patient were reviewed by me and considered in my medical decision making (see chart for details).   _________________________________________   FINAL CLINICAL IMPRESSION(S) / ED DIAGNOSES  Final diagnoses:  Peripheral edema  Cellulitis of right lower extremity    ED Discharge Orders         Ordered    sulfamethoxazole-trimethoprim (BACTRIM DS) 800-160 MG tablet  2 times daily     Discontinue  Reprint     10/06/19 1249           If controlled substance prescribed during this visit, 12 month history viewed on the [place] prior to issuing an initial prescription for Schedule II or III opiod.   Victorino Dike, FNP 10/07/19 1352    Harvest Dark, MD 10/07/19 1539

## 2019-10-06 NOTE — Discharge Instructions (Signed)
Please follow-up with your primary care provider if not improving over the weekend.  Return to the emergency department for symptoms change or worsen if you are unable to schedule appointment.  Take the antibiotics and elevate your leg as often as possible over the weekend.

## 2019-10-06 NOTE — ED Triage Notes (Signed)
Pt clarified to RN that she is not having pain but is concerned because her leg is red in that area. Pt reports it was bruised a really deep dark color but now is just red.

## 2019-11-28 ENCOUNTER — Ambulatory Visit: Payer: Self-pay | Attending: Oncology

## 2020-06-06 ENCOUNTER — Emergency Department
Admission: EM | Admit: 2020-06-06 | Discharge: 2020-06-06 | Disposition: A | Discharge status: Home / Self Care | Payer: Self-pay | Attending: Emergency Medicine | Admitting: Emergency Medicine

## 2020-06-06 ENCOUNTER — Other Ambulatory Visit: Payer: Self-pay

## 2020-06-06 ENCOUNTER — Emergency Department: Payer: Self-pay

## 2020-06-06 DIAGNOSIS — M7989 Other specified soft tissue disorders: Secondary | ICD-10-CM

## 2020-06-06 DIAGNOSIS — Z5321 Procedure and treatment not carried out due to patient leaving prior to being seen by health care provider: Secondary | ICD-10-CM | POA: Insufficient documentation

## 2020-06-06 DIAGNOSIS — M7122 Synovial cyst of popliteal space [Baker], left knee: Secondary | ICD-10-CM | POA: Insufficient documentation

## 2020-06-06 MED ORDER — TRAMADOL HCL 50 MG PO TABS: 50.0000 mg | ORAL_TABLET | Freq: Every evening | ORAL | 0 refills | Status: AC | PRN

## 2020-06-06 NOTE — ED Triage Notes (Signed)
Pt c/o left leg swelling for the past couple of days and is having a lot of pain from the buttock down the leg, states she has chronic lower back pain but never had the swelling before. No redness reported.

## 2020-06-06 NOTE — ED Notes (Signed)
See triage note  States she developed left leg pain about 2 weeks ago  No injury  States pain started in lower back but is now from knee into lower

## 2020-06-07 NOTE — ED Provider Notes (Signed)
Procedures  Clinical Course as of 06/07/20 2321  Thu Jun 06, 2020  1348 Prelim report from radiology - LLE [place] negative for DVT. There is a 5cm nonruptured Baker's cyst. [PS]    Clinical Course User Index [PS] Sharman Cheek, MD    ----------------------------------------- 11:21 PM on 06/07/2020 ----------------------------------------- Pt not seen by me - was triaged to flex area of ED    Sharman Cheek, MD 06/07/20 2322

## 2021-04-30 IMAGING — US US EXTREM LOW VENOUS*R*
1 series · 14 of 24 positions shown · non-contrast
Comparison: None.

CLINICAL DATA: Acute right lower extremity swelling.

EXAM:
Right LOWER EXTREMITY VENOUS DOPPLER ULTRASOUND
TECHNIQUE: Gray-scale sonography with compression, as well as color and duplex
ultrasound, were performed to evaluate the deep venous system(s)
from the level of the common femoral vein through the popliteal and
proximal calf veins.

[Series 1: us venous img lower uni right (dvt) · portal-venous · 14 of 54 slices shown]
[im 1/54]
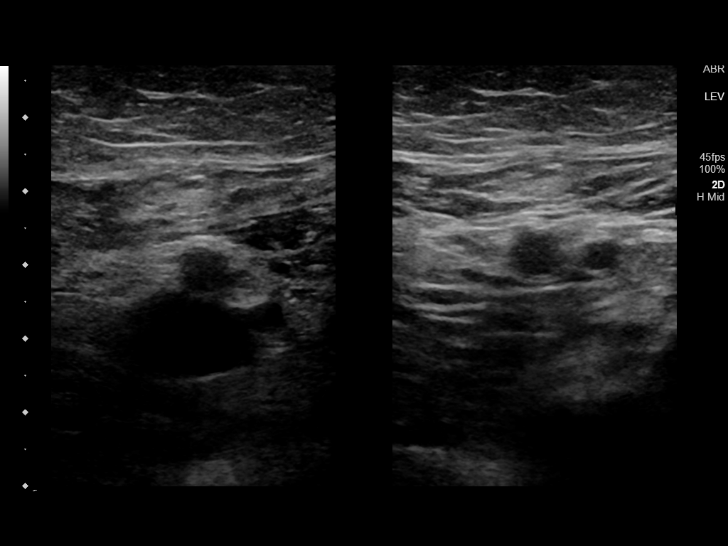
[im 5/54]
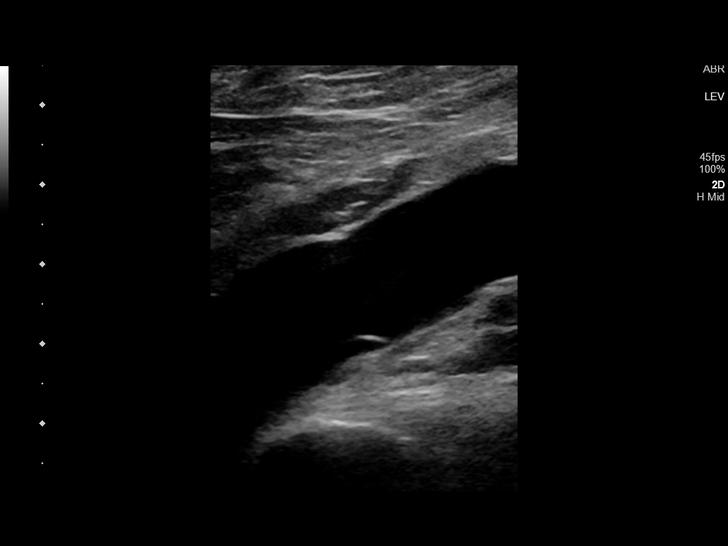
[im 10/54]
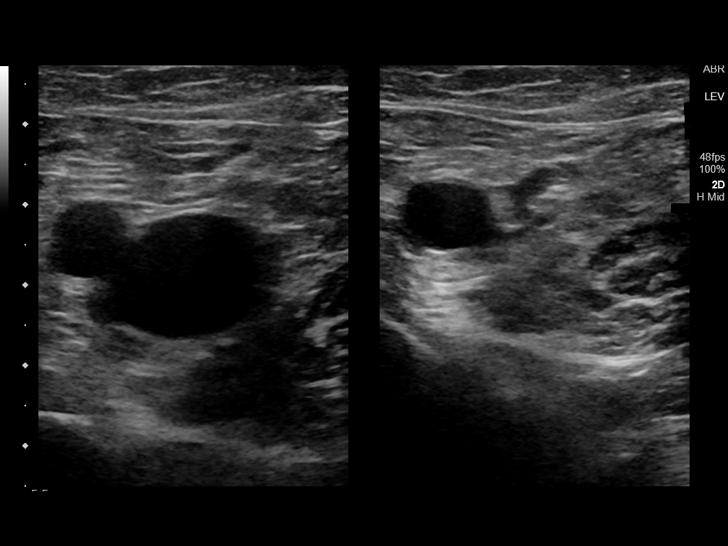
[im 14/54]
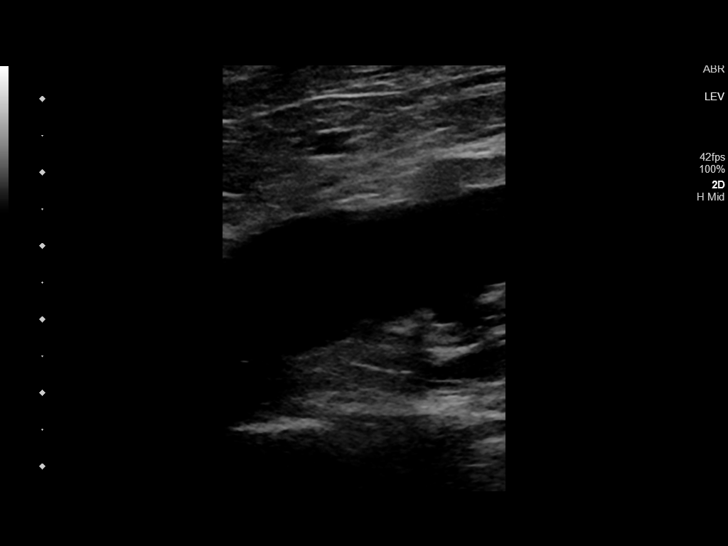
[im 17/54]
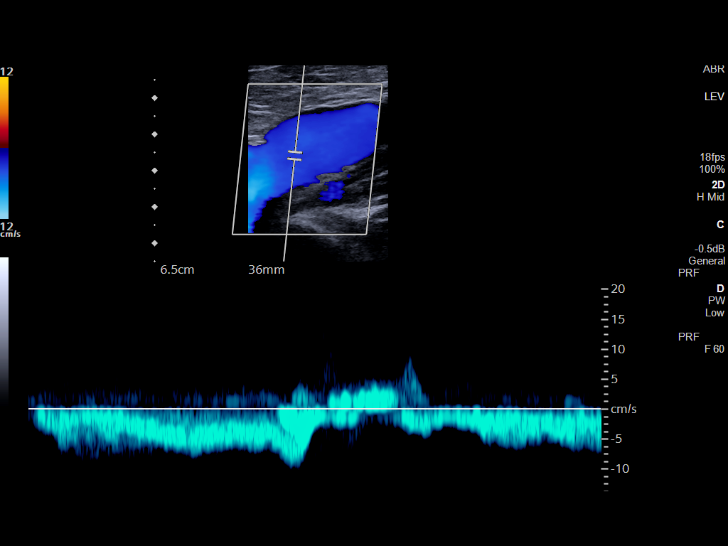
[im 21/54]
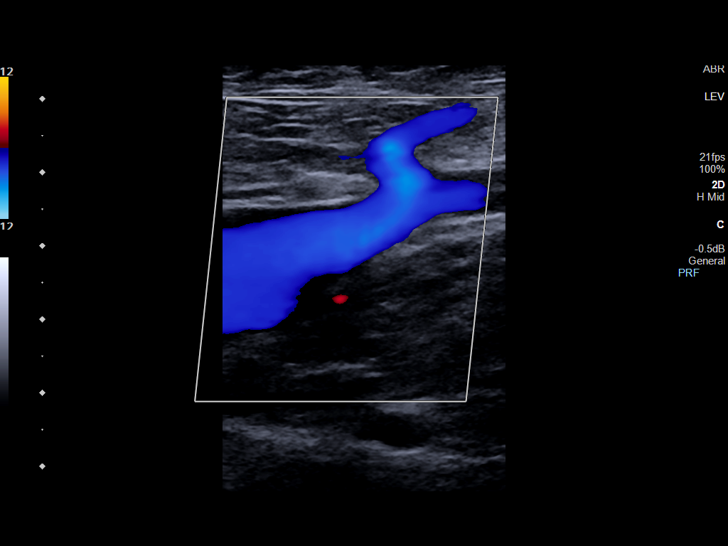
[im 26/54]
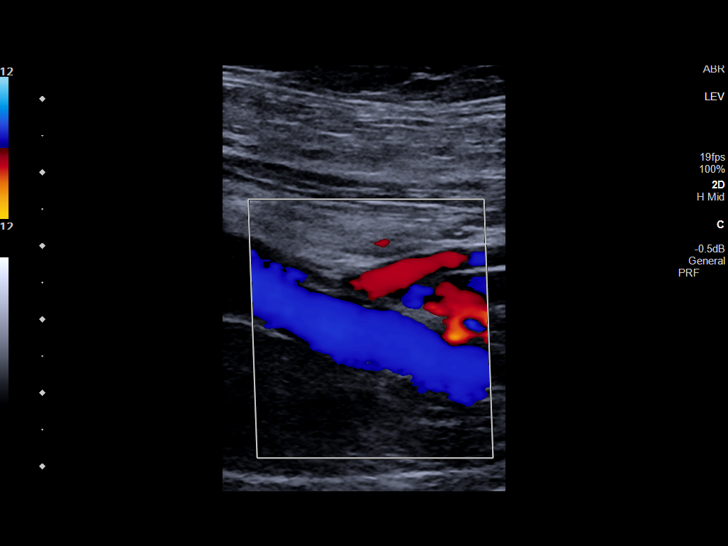
[im 28/54]
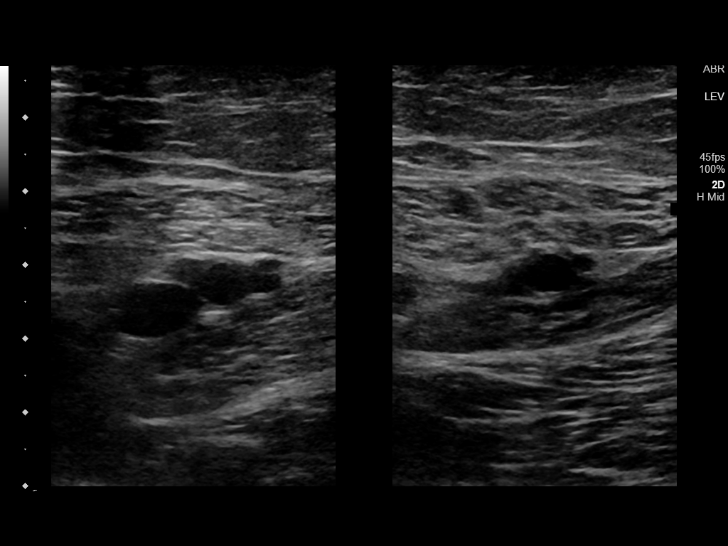
[im 33/54]
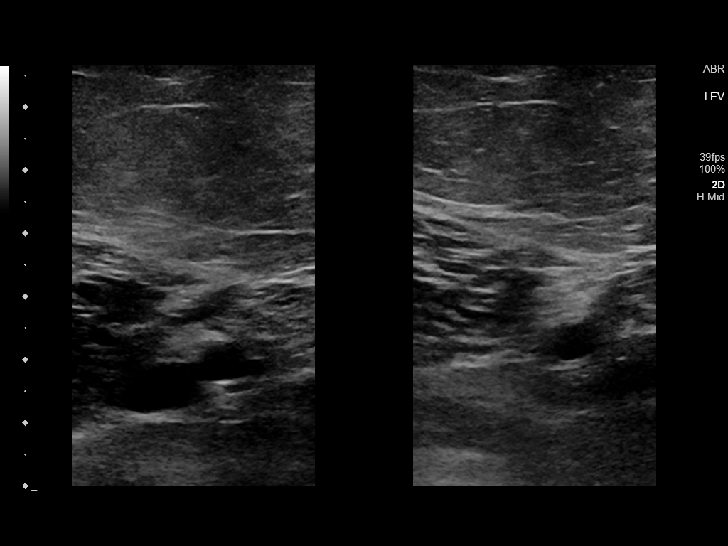
[im 37/54]
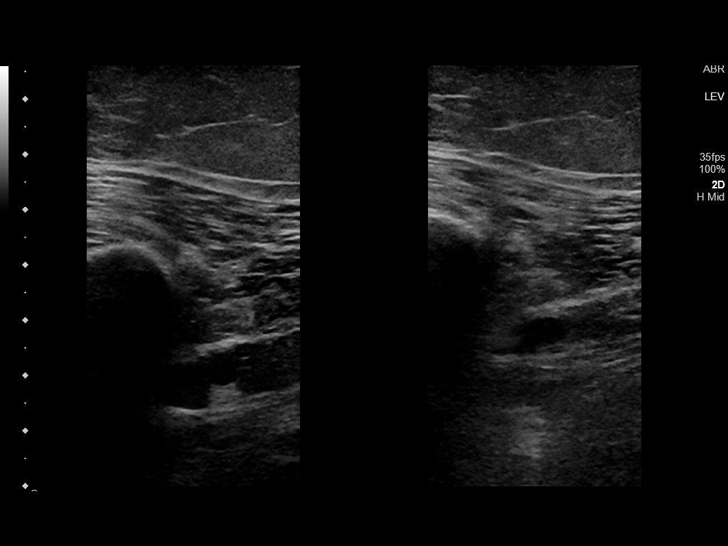
[im 42/54]
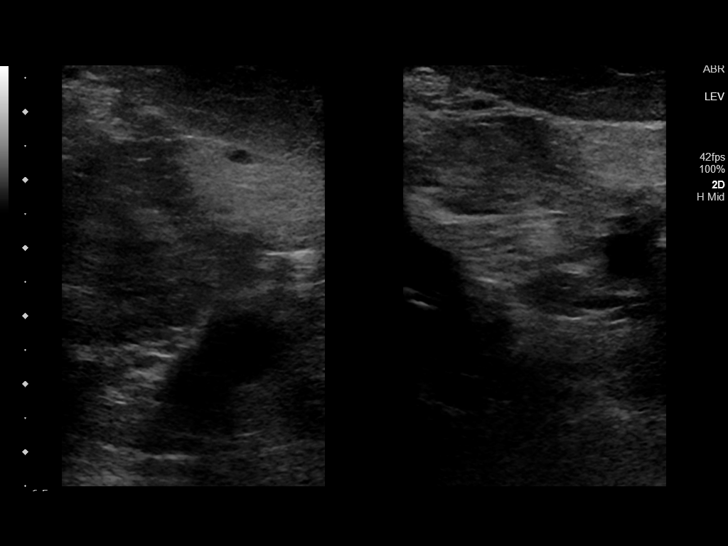
[im 44/54]
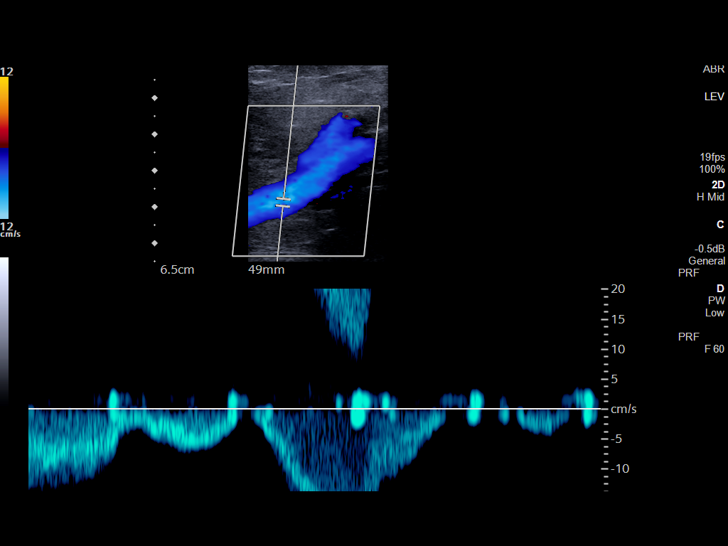
[im 49/54]
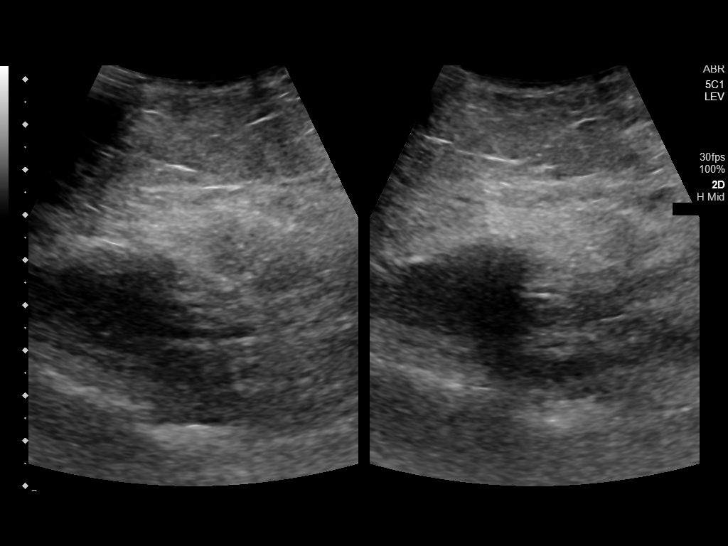
[im 54/54]
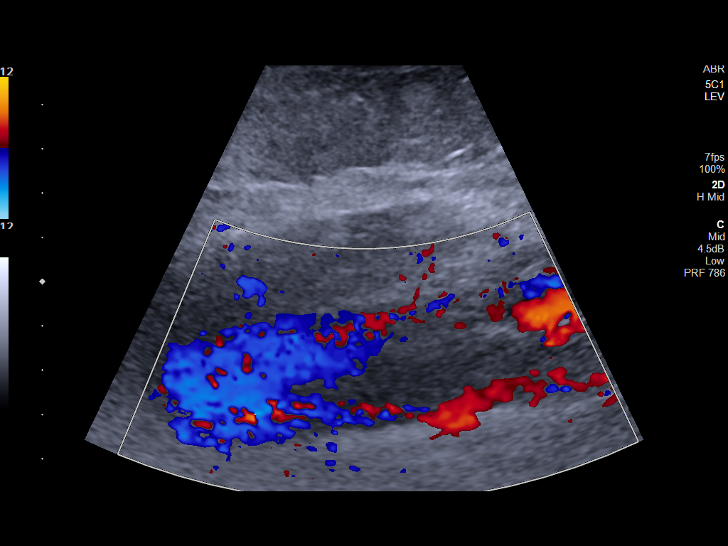

[14 of 24 positions shown; findings below may reference images not displayed]

FINDINGS: VENOUS

Normal compressibility of the common femoral, superficial femoral,
and popliteal veins, as well as the visualized calf veins.
Visualized portions of profunda femoral vein and great saphenous
vein unremarkable. No filling defects to suggest DVT on grayscale or
color Doppler imaging. Doppler waveforms show normal direction of
venous flow, normal respiratory plasticity and response to
augmentation.

Limited views of the contralateral common femoral vein are
unremarkable.

OTHER

None.

Limitations: none
IMPRESSION: Negative.

## 2021-11-10 ENCOUNTER — Other Ambulatory Visit (HOSPITAL_COMMUNITY): Payer: Self-pay | Admitting: Orthopedic Surgery

## 2021-11-10 ENCOUNTER — Other Ambulatory Visit: Payer: Self-pay | Admitting: Orthopedic Surgery

## 2021-11-10 DIAGNOSIS — S46001D Unspecified injury of muscle(s) and tendon(s) of the rotator cuff of right shoulder, subsequent encounter: Secondary | ICD-10-CM

## 2021-12-01 ENCOUNTER — Encounter (HOSPITAL_COMMUNITY): Payer: Self-pay | Admitting: Certified Registered Nurse Anesthetist

## 2021-12-01 ENCOUNTER — Other Ambulatory Visit: Payer: Self-pay

## 2021-12-01 ENCOUNTER — Encounter (HOSPITAL_COMMUNITY): Payer: Self-pay | Admitting: *Deleted

## 2021-12-01 NOTE — Progress Notes (Signed)
PCP - [facility] in [place] was seeing a doctor who has now left Cardiologist - Denies  PPM/ICD - Denies   Chest x-ray - NI EKG - Day of surgery  Stress Test - Denies ECHO - Denies Cardiac Cath - Denies  Sleep Study - Denies    DM -denies  Anesthesia review: No  Patient denies shortness of breath, fever, cough and chest pain at PAT appointment   All instructions explained to the patient, with a verbal understanding.  The opportunity to ask questions was provided.

## 2021-12-01 NOTE — Anesthesia Preprocedure Evaluation (Signed)
Anesthesia Evaluation    Reviewed: Allergy & Precautions, Patient's Chart, lab work & pertinent test results  Airway        Dental   Pulmonary neg pulmonary ROS,           Cardiovascular hypertension, Pt. on medications      Neuro/Psych negative neurological ROS     GI/Hepatic negative GI ROS, Neg liver ROS,   Endo/Other  Obesity   Renal/GU negative Renal ROS     Musculoskeletal injury of tendon of right rotator cuff   Abdominal   Peds  Hematology negative hematology ROS (+)   Anesthesia Other Findings   Reproductive/Obstetrics                             Anesthesia Physical Anesthesia Plan  ASA: 2  Anesthesia Plan: General   Post-op Pain Management: Minimal or no pain anticipated   Induction: Intravenous  PONV Risk Score and Plan: 3 and Midazolam, Dexamethasone and Ondansetron  Airway Management Planned: LMA  Additional Equipment:   Intra-op Plan:   Post-operative Plan: Extubation in OR  Informed Consent:   Plan Discussed with:   Anesthesia Plan Comments:         Anesthesia Quick Evaluation

## 2021-12-02 ENCOUNTER — Ambulatory Visit (HOSPITAL_COMMUNITY): Admission: RE | Admit: 2021-12-02 | Payer: Medicare Other | Source: Ambulatory Visit

## 2021-12-02 ENCOUNTER — Other Ambulatory Visit: Payer: Self-pay | Admitting: Physician Assistant

## 2021-12-02 ENCOUNTER — Encounter (HOSPITAL_COMMUNITY): Payer: Self-pay

## 2021-12-02 SURGERY — MRI WITH ANESTHESIA
Anesthesia: General | Laterality: Right

## 2021-12-30 IMAGING — US US EXTREM LOW VENOUS*L*
1 series · 13 of 24 positions shown · non-contrast
Comparison: None.

CLINICAL DATA: 64-year-old female with pain and swelling left leg



[Series 1: us venous img lower uni left (dvt) · portal-venous · 42 acquisitions, 13 frames shown]
[im 1/42]
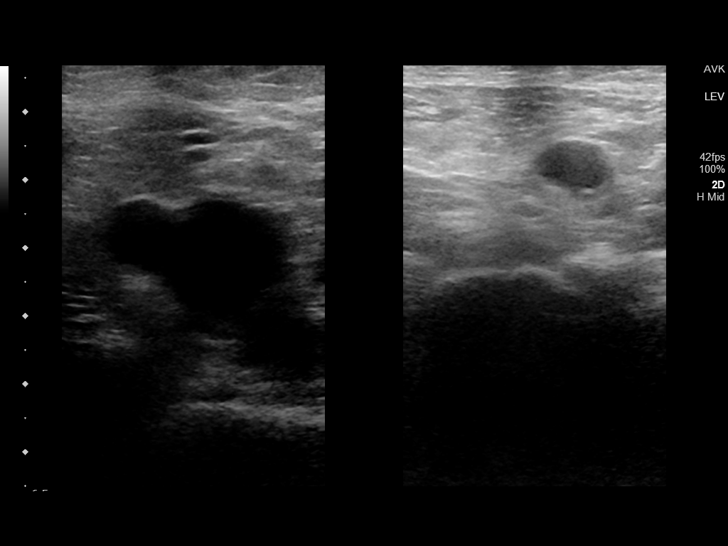
[im 4/42]
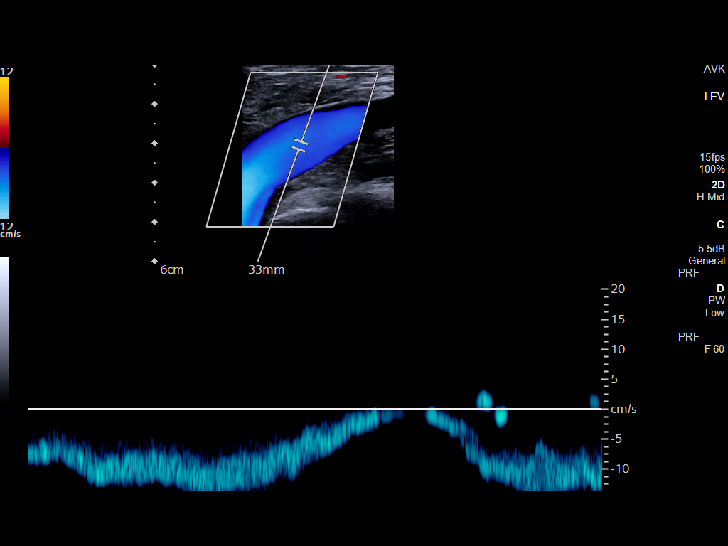
[im 8/42]
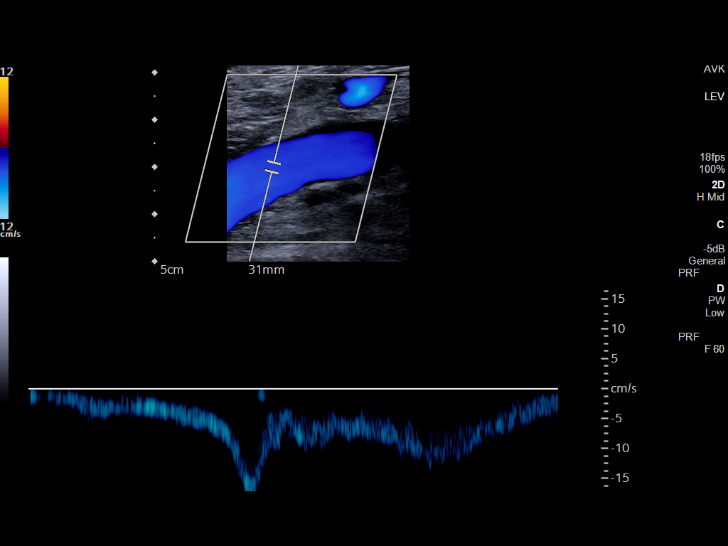
[im 11/42]
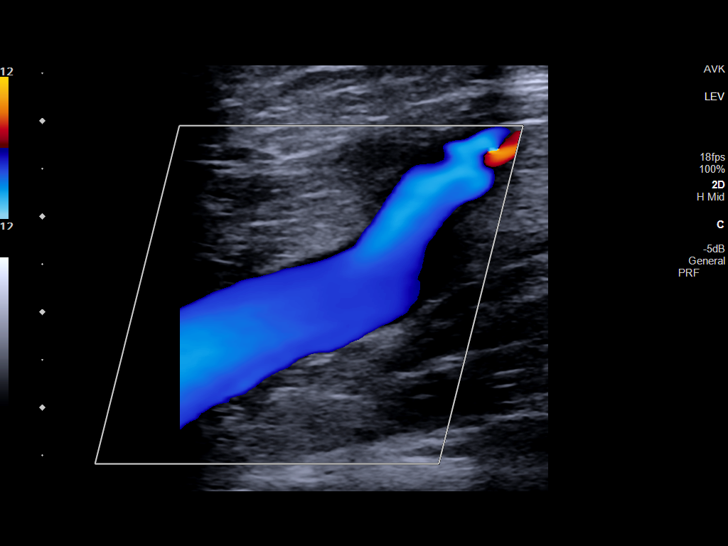
[im 15/42]
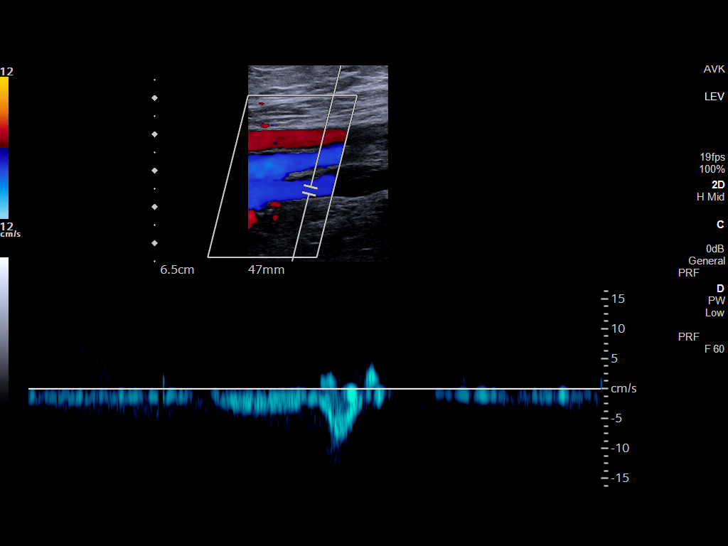
[im 18/42]
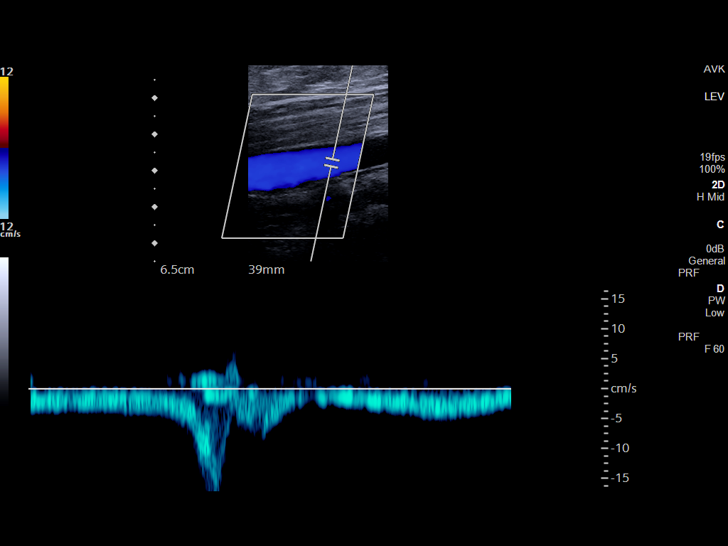
[im 24/42]
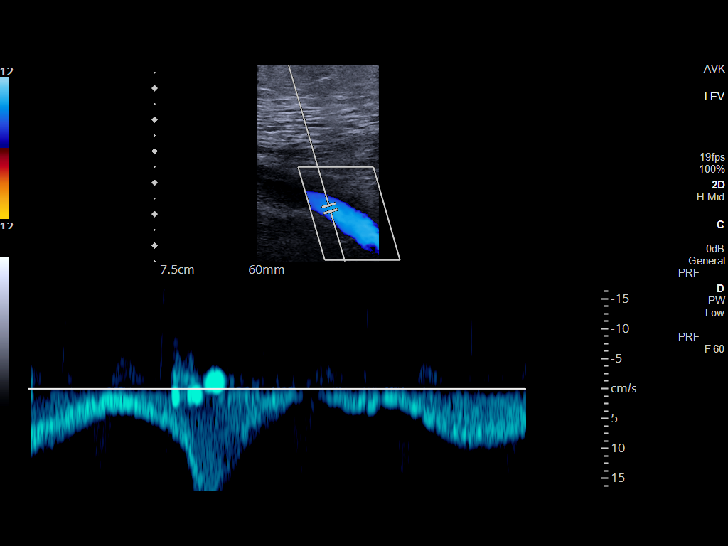
[im 25/42]
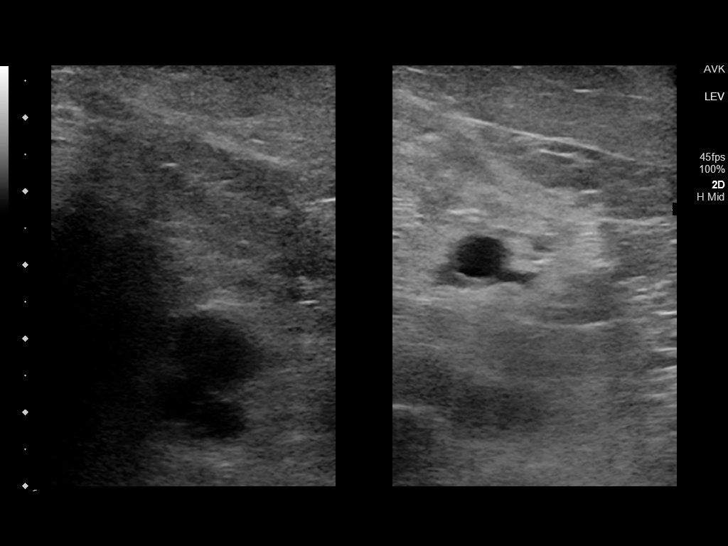
[im 29/42]
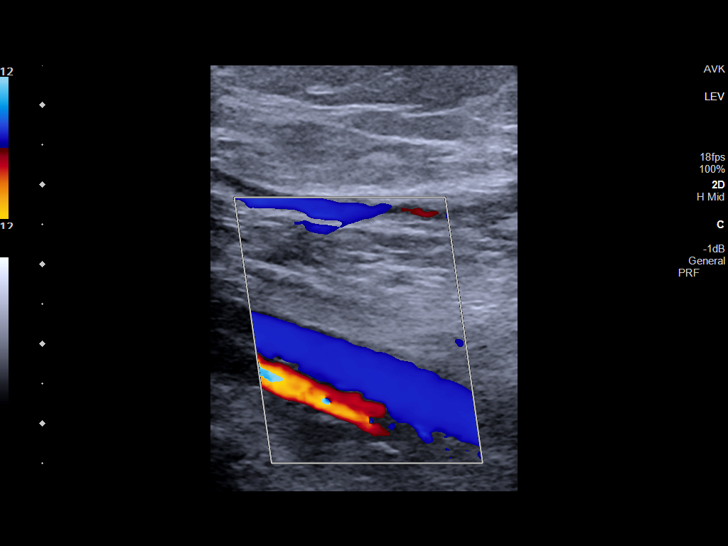
[im 33/42]
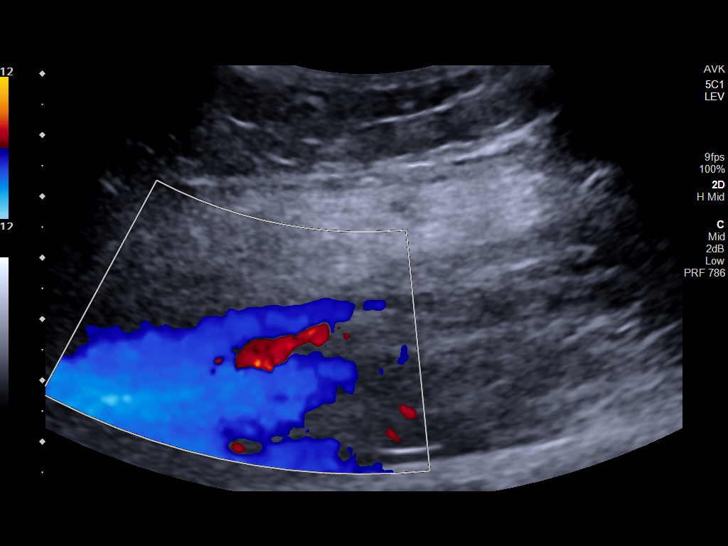
[im 36/42]
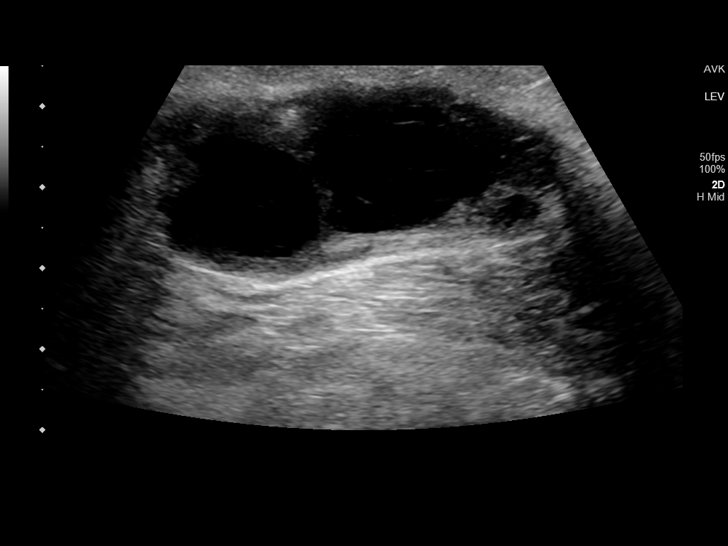
[im 38/42]
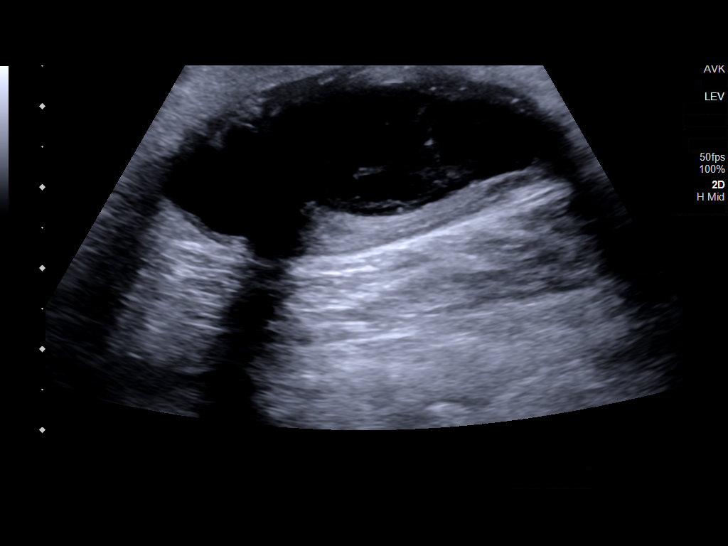
[im 42/42]
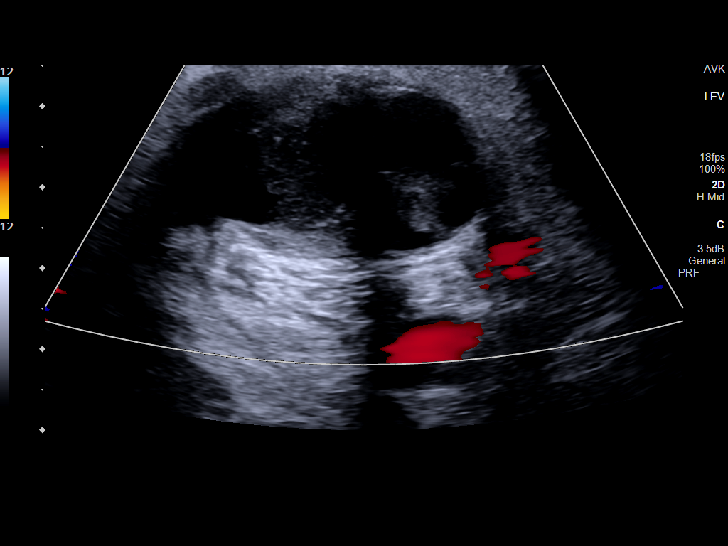

[13 of 24 positions shown; findings below may reference images not displayed]

FINDINGS: Contralateral Common Femoral Vein: Respiratory phasicity is normal
and symmetric with the symptomatic side. No evidence of thrombus.
Normal compressibility.

Common Femoral Vein: No evidence of thrombus. Normal
compressibility, respiratory phasicity and response to augmentation.

Saphenofemoral Junction: No evidence of thrombus. Normal
compressibility and flow on color Doppler imaging.

Profunda Femoral Vein: No evidence of thrombus. Normal
compressibility and flow on color Doppler imaging.

Femoral Vein: No evidence of thrombus. Normal compressibility,
respiratory phasicity and response to augmentation.

Popliteal Vein: No evidence of thrombus. Normal compressibility,
respiratory phasicity and response to augmentation.

Calf Veins: No evidence of thrombus. Normal compressibility and flow
on color Doppler imaging.

Superficial Great Saphenous Vein: No evidence of thrombus. Normal
compressibility and flow on color Doppler imaging.

Other Findings: Complex Baker's cyst in the popliteal region, 5.0 cm
x 2.0 cm x 4.2 cm
IMPRESSION: Sonographic survey negative for left lower extremity DVT.

Left-sided complex Baker's cyst

## 2022-02-16 ENCOUNTER — Other Ambulatory Visit: Payer: Self-pay | Admitting: Nurse Practitioner

## 2022-02-16 ENCOUNTER — Ambulatory Visit
Admission: RE | Admit: 2022-02-16 | Discharge: 2022-02-16 | Disposition: A | Discharge status: Home / Self Care | Payer: Medicare Other | Source: Ambulatory Visit | Attending: Nurse Practitioner | Admitting: Nurse Practitioner

## 2022-02-16 DIAGNOSIS — R2241 Localized swelling, mass and lump, right lower limb: Secondary | ICD-10-CM

## 2022-02-17 ENCOUNTER — Other Ambulatory Visit: Payer: Self-pay | Admitting: Nurse Practitioner

## 2022-02-17 DIAGNOSIS — Z1231 Encounter for screening mammogram for malignant neoplasm of breast: Secondary | ICD-10-CM

## 2022-04-10 ENCOUNTER — Encounter (INDEPENDENT_AMBULATORY_CARE_PROVIDER_SITE_OTHER): Payer: Self-pay | Admitting: Vascular Surgery

## 2022-04-14 ENCOUNTER — Encounter (INDEPENDENT_AMBULATORY_CARE_PROVIDER_SITE_OTHER): Payer: Self-pay | Admitting: Nurse Practitioner

## 2022-04-28 ENCOUNTER — Encounter (INDEPENDENT_AMBULATORY_CARE_PROVIDER_SITE_OTHER): Payer: Medicare Other | Admitting: Vascular Surgery

## 2022-05-14 ENCOUNTER — Encounter (INDEPENDENT_AMBULATORY_CARE_PROVIDER_SITE_OTHER): Payer: Medicare Other | Admitting: Vascular Surgery

## 2022-06-29 ENCOUNTER — Ambulatory Visit
Admission: EM | Admit: 2022-06-29 | Discharge: 2022-06-29 | Disposition: A | Discharge status: Home / Self Care | Payer: Medicare Other | Attending: Emergency Medicine | Admitting: Emergency Medicine

## 2022-06-29 ENCOUNTER — Ambulatory Visit (INDEPENDENT_AMBULATORY_CARE_PROVIDER_SITE_OTHER): Payer: Medicare Other

## 2022-06-29 DIAGNOSIS — M79672 Pain in left foot: Secondary | ICD-10-CM

## 2022-06-29 DIAGNOSIS — T148XXA Other injury of unspecified body region, initial encounter: Secondary | ICD-10-CM

## 2022-06-29 DIAGNOSIS — S9032XA Contusion of left foot, initial encounter: Secondary | ICD-10-CM

## 2022-06-29 NOTE — ED Provider Notes (Signed)
Roderic Palau    CSN: [id number] Arrival date & time: 06/29/22  [id number]      History   Chief Complaint Chief Complaint  Patient presents with   Foot Pain    HPI [name] is a 67 y.o. female.  Patient presents with left foot pain x 2 days.  It started when she accidentally tripped and fell at work on 06/27/2022.  Her toe has a large blood blister and her foot is bruised.  She denies numbness, weakness, or other symptoms.  Treating with rest and elevation.  No OTC medications taken.  Her medical history includes hypertension.    The history is provided by the patient and medical records.    Past Medical History:  Diagnosis Date   Hypertension     There are no problems to display for this patient.   Past Surgical History:  Procedure Laterality Date   BACK SURGERY     plate and screws - lumbar   CHOLECYSTECTOMY      OB History   No obstetric history on file.      Home Medications    Prior to Admission medications   Medication Sig Start Date End Date Taking? Authorizing Provider  Ascorbic Acid (VITAMIN C PO) Take by mouth.    [provider]  chlorthalidone (HYGROTON) 25 MG tablet Take 25 mg by mouth daily.    [provider]  Cholecalciferol (VITAMIN D3 PO) Take by mouth.    [provider]  fluticasone (FLONASE) 50 MCG/ACT nasal spray Place 1 spray into both nostrils daily.    [provider]  meloxicam (MOBIC) 15 MG tablet Take 15 mg by mouth daily. 11/07/21   [provider]    Family History History reviewed. No pertinent family history.  Social History Social History   Tobacco Use   Smoking status: Never   Smokeless tobacco: Never  Vaping Use   Vaping Use: Never used  Substance Use Topics   Alcohol use: Not Currently   Drug use: Not Currently     Allergies   Oxycodone-acetaminophen   Review of Systems Review of Systems  Musculoskeletal:  Positive for arthralgias, gait problem and joint  swelling.  Skin:  Positive for color change and wound.  Neurological:  Negative for weakness and numbness.  All other systems reviewed and are negative.    Physical Exam Triage Vital Signs ED Triage Vitals  Enc Vitals Group     BP 06/29/22 1025 (!) 143/83     Pulse Rate 06/29/22 1016 99     Resp 06/29/22 1016 18     Temp 06/29/22 1016 97.6 F (36.4 C)     Temp src --      SpO2 06/29/22 1016 97 %     Weight --      Height --      Head Circumference --      Peak Flow --      Pain Score 06/29/22 1021 5     Pain Loc --      Pain Edu? --      Excl. in [place]? --    No data found.  Updated Vital Signs BP (!) 143/83   Pulse 99   Temp 97.6 F (36.4 C)   Resp 18   SpO2 97%   Visual Acuity Right Eye Distance:   Left Eye Distance:   Bilateral Distance:    Right Eye Near:   Left Eye Near:    Bilateral Near:  Physical Exam Vitals and nursing note reviewed.  Constitutional:      General: She is not in acute distress.    Appearance: She is well-developed. She is not ill-appearing.  HENT:     Mouth/Throat:     Mouth: Mucous membranes are moist.  Cardiovascular:     Rate and Rhythm: Normal rate and regular rhythm.     Heart sounds: Normal heart sounds.  Pulmonary:     Effort: Pulmonary effort is normal. No respiratory distress.     Breath sounds: Normal breath sounds.  Musculoskeletal:        General: Swelling and tenderness present. No deformity. Normal range of motion.     Cervical back: Neck supple.  Skin:    General: Skin is warm and dry.     Capillary Refill: Capillary refill takes less than 2 seconds.     Findings: Bruising and lesion present.  Neurological:     General: No focal deficit present.     Mental Status: She is alert and oriented to person, place, and time.     Sensory: No sensory deficit.     Motor: No weakness.     Gait: Gait abnormal.  Psychiatric:        Mood and Affect: Mood normal.        Behavior: Behavior normal.           UC  Treatments / Results  Labs (all labs ordered are listed, but only abnormal results are displayed) Labs Reviewed - No data to display  EKG   Radiology DG Foot Complete Left  Result Date: 06/29/2022 CLINICAL DATA:  Left foot pain following blunt trauma, initial encounter EXAM: LEFT FOOT - COMPLETE 3+ VIEW COMPARISON:  None Available. FINDINGS: Degenerative changes are noted in the tarsal bones and metatarsal tarsal articulations. No acute fracture or dislocation is noted. Soft tissue prominence is noted in the first toe consistent with the given clinical history. IMPRESSION: No acute bony abnormality noted. Electronically Signed   By: Inez Catalina M.D.   On: 06/29/2022 10:47    Procedures Procedures (including critical care time)  Medications Ordered in UC Medications - No data to display  Initial Impression / Assessment and Plan / UC Course  I have reviewed the triage vital signs and the nursing notes.  Pertinent labs & imaging results that were available during my care of the patient were reviewed by me and considered in my medical decision making (see chart for details).    Contusion of left foot, left foot pain, hemorrhagic blister.  Xray negative.  Wound care instructions discussed.  Instructed patient to follow up with her PCP or an orthopedist if her symptoms are not improving.  Contact information for oncall ortho provided.  She agrees to plan of care.    Final Clinical Impressions(s) / UC Diagnoses   Final diagnoses:  Left foot pain  Blood blister  Contusion of left foot, initial encounter     Discharge Instructions      Keep your wound clean and dry.  Wash it gently twice a day with soap and water.  Apply an antibiotic cream and bandage twice a day.    Your xray does not show any broken bones.    Follow up with your primary care provider or an orthopedist if your symptoms are not improving.           ED Prescriptions   None    PDMP not reviewed this  encounter.   Barkley Boards  H, NP 06/29/22 1058

## 2022-06-29 NOTE — ED Triage Notes (Signed)
Patient to Urgent Care with complaints of left sided foot pain (large blood filled blister present to great toe) that started after an injury.   Reports a fall on Saturday, states she tripped over clothing while she was putting it away. Wearing shoes at the time.

## 2022-06-29 NOTE — Discharge Instructions (Addendum)
Keep your wound clean and dry.  Wash it gently twice a day with soap and water.  Apply an antibiotic cream and bandage twice a day.    Your xray does not show any broken bones.    Follow up with your primary care provider or an orthopedist if your symptoms are not improving.

## 2023-12-07 ENCOUNTER — Other Ambulatory Visit: Payer: Self-pay | Admitting: Nurse Practitioner

## 2023-12-07 DIAGNOSIS — Z780 Asymptomatic menopausal state: Secondary | ICD-10-CM
